# Patient Record
Sex: Female | Born: 1970
Health system: Southern US, Community
[De-identification: ages and names within clinical notes are randomized; demographics above are authoritative.]

## PROBLEM LIST (undated history)

## (undated) DIAGNOSIS — F319 Bipolar disorder, unspecified: Secondary | ICD-10-CM

## (undated) DIAGNOSIS — E349 Endocrine disorder, unspecified: Secondary | ICD-10-CM

## (undated) HISTORY — PX: DILATION AND CURETTAGE OF UTERUS: SHX78

## (undated) HISTORY — DX: Endocrine disorder, unspecified: E34.9

## (undated) HISTORY — PX: CERVICAL BIOPSY  W/ LOOP ELECTRODE EXCISION: SUR135

## (undated) HISTORY — PX: OTHER SURGICAL HISTORY: SHX169

## (undated) HISTORY — DX: Bipolar disorder, unspecified: F31.9

---

## 2017-09-24 DIAGNOSIS — Z0389 Encounter for observation for other suspected diseases and conditions ruled out: Secondary | ICD-10-CM | POA: Diagnosis not present

## 2017-09-24 DIAGNOSIS — E559 Vitamin D deficiency, unspecified: Secondary | ICD-10-CM | POA: Diagnosis not present

## 2017-09-24 DIAGNOSIS — Z0000 Encounter for general adult medical examination without abnormal findings: Secondary | ICD-10-CM | POA: Diagnosis not present

## 2017-09-24 DIAGNOSIS — F3178 Bipolar disorder, in full remission, most recent episode mixed: Secondary | ICD-10-CM | POA: Diagnosis not present

## 2017-09-24 DIAGNOSIS — R6882 Decreased libido: Secondary | ICD-10-CM | POA: Diagnosis not present

## 2017-09-24 DIAGNOSIS — R5383 Other fatigue: Secondary | ICD-10-CM | POA: Diagnosis not present

## 2017-09-24 DIAGNOSIS — Z13220 Encounter for screening for lipoid disorders: Secondary | ICD-10-CM | POA: Diagnosis not present

## 2017-09-25 ENCOUNTER — Ambulatory Visit: Payer: Self-pay | Admitting: Physician Assistant

## 2017-10-19 ENCOUNTER — Telehealth: Payer: Self-pay | Admitting: *Deleted

## 2017-10-19 DIAGNOSIS — F319 Bipolar disorder, unspecified: Secondary | ICD-10-CM | POA: Diagnosis not present

## 2017-10-19 NOTE — Telephone Encounter (Signed)
This person called to be a scheduled as a new patient to scheduled with Lucianne Muss, she stated that she talked with someone in this office and they told her the only thing she have to do choose a doctor, and she can be scheduled. because she work for [employer] I thought maybe I could schedule her, but she did not have any information in the system, she failed to tell me first hand that she just moved from [place], and Just got hired, therefore there was absolutely no information in the system for the doctors to see to schedule her, and I told her that, and she got upset with me because I could not schedule her an appt.  When I asked her her name, she got smart with me because I asked her to repeat her name.  She said that she felt like  She said she felt as if she was defending herself, I apologize to her and said I am sorry you feel that way.    I was very professional with, not upset or rude.  Rosann Auerbach

## 2017-11-14 DIAGNOSIS — E559 Vitamin D deficiency, unspecified: Secondary | ICD-10-CM | POA: Diagnosis not present

## 2017-11-14 DIAGNOSIS — Z0389 Encounter for observation for other suspected diseases and conditions ruled out: Secondary | ICD-10-CM | POA: Diagnosis not present

## 2017-11-14 DIAGNOSIS — R6882 Decreased libido: Secondary | ICD-10-CM | POA: Diagnosis not present

## 2017-11-14 DIAGNOSIS — R5383 Other fatigue: Secondary | ICD-10-CM | POA: Diagnosis not present

## 2017-11-21 DIAGNOSIS — F319 Bipolar disorder, unspecified: Secondary | ICD-10-CM | POA: Diagnosis not present

## 2017-12-06 MED FILL — LITHIUM ER 450 MG TABLET: 450 | 90 days supply | Qty: 180 | Fill #0

## 2017-12-11 MED FILL — NP THYROID 30 MG TABLET: 30 | 30 days supply | Qty: 30 | Fill #0

## 2017-12-18 DIAGNOSIS — F319 Bipolar disorder, unspecified: Secondary | ICD-10-CM | POA: Diagnosis not present

## 2017-12-26 ENCOUNTER — Ambulatory Visit: Payer: Self-pay | Admitting: Family Medicine

## 2018-01-02 DIAGNOSIS — F319 Bipolar disorder, unspecified: Secondary | ICD-10-CM | POA: Diagnosis not present

## 2018-01-14 DIAGNOSIS — R5383 Other fatigue: Secondary | ICD-10-CM | POA: Diagnosis not present

## 2018-01-14 DIAGNOSIS — R6882 Decreased libido: Secondary | ICD-10-CM | POA: Diagnosis not present

## 2018-01-14 DIAGNOSIS — F3178 Bipolar disorder, in full remission, most recent episode mixed: Secondary | ICD-10-CM | POA: Diagnosis not present

## 2018-01-16 DIAGNOSIS — F319 Bipolar disorder, unspecified: Secondary | ICD-10-CM | POA: Diagnosis not present

## 2018-01-18 DIAGNOSIS — F319 Bipolar disorder, unspecified: Secondary | ICD-10-CM | POA: Diagnosis not present

## 2018-01-18 DIAGNOSIS — Z79891 Long term (current) use of opiate analgesic: Secondary | ICD-10-CM | POA: Diagnosis not present

## 2018-01-18 DIAGNOSIS — F411 Generalized anxiety disorder: Secondary | ICD-10-CM | POA: Diagnosis not present

## 2018-01-30 DIAGNOSIS — F319 Bipolar disorder, unspecified: Secondary | ICD-10-CM | POA: Diagnosis not present

## 2018-01-30 DIAGNOSIS — F411 Generalized anxiety disorder: Secondary | ICD-10-CM | POA: Diagnosis not present

## 2018-02-11 ENCOUNTER — Ambulatory Visit: Payer: Self-pay | Admitting: Family Medicine

## 2018-02-28 DIAGNOSIS — F411 Generalized anxiety disorder: Secondary | ICD-10-CM | POA: Diagnosis not present

## 2018-02-28 DIAGNOSIS — F319 Bipolar disorder, unspecified: Secondary | ICD-10-CM | POA: Diagnosis not present

## 2018-03-04 DIAGNOSIS — R5383 Other fatigue: Secondary | ICD-10-CM | POA: Diagnosis not present

## 2018-03-04 DIAGNOSIS — F3178 Bipolar disorder, in full remission, most recent episode mixed: Secondary | ICD-10-CM | POA: Diagnosis not present

## 2018-03-04 DIAGNOSIS — Z5181 Encounter for therapeutic drug level monitoring: Secondary | ICD-10-CM | POA: Diagnosis not present

## 2018-03-18 MED FILL — LITHIUM ER 450 MG TABLET: 450 | 90 days supply | Qty: 180 | Fill #0

## 2018-04-03 DIAGNOSIS — F319 Bipolar disorder, unspecified: Secondary | ICD-10-CM | POA: Diagnosis not present

## 2018-04-03 DIAGNOSIS — F411 Generalized anxiety disorder: Secondary | ICD-10-CM | POA: Diagnosis not present

## 2018-04-10 DIAGNOSIS — F411 Generalized anxiety disorder: Secondary | ICD-10-CM | POA: Diagnosis not present

## 2018-04-10 DIAGNOSIS — F319 Bipolar disorder, unspecified: Secondary | ICD-10-CM | POA: Diagnosis not present

## 2018-05-01 DIAGNOSIS — R05 Cough: Secondary | ICD-10-CM | POA: Diagnosis not present

## 2018-05-01 DIAGNOSIS — F319 Bipolar disorder, unspecified: Secondary | ICD-10-CM | POA: Diagnosis not present

## 2018-05-01 DIAGNOSIS — F411 Generalized anxiety disorder: Secondary | ICD-10-CM | POA: Diagnosis not present

## 2018-05-14 ENCOUNTER — Other Ambulatory Visit (HOSPITAL_COMMUNITY): Payer: Self-pay | Admitting: Internal Medicine

## 2018-05-14 ENCOUNTER — Ambulatory Visit (HOSPITAL_COMMUNITY)
Admission: RE | Admit: 2018-05-14 | Discharge: 2018-05-14 | Disposition: A | Discharge status: Home / Self Care | Payer: 59 | Source: Ambulatory Visit | Attending: Internal Medicine | Admitting: Internal Medicine

## 2018-05-14 DIAGNOSIS — R059 Cough, unspecified: Secondary | ICD-10-CM

## 2018-05-14 DIAGNOSIS — R05 Cough: Secondary | ICD-10-CM | POA: Insufficient documentation

## 2018-05-14 DIAGNOSIS — R0602 Shortness of breath: Secondary | ICD-10-CM | POA: Diagnosis not present

## 2018-05-21 DIAGNOSIS — R05 Cough: Secondary | ICD-10-CM | POA: Diagnosis not present

## 2018-05-21 MED FILL — NP THYROID 90 MG TABLET: 90 | 30 days supply | Qty: 30 | Fill #0

## 2018-05-30 DIAGNOSIS — F411 Generalized anxiety disorder: Secondary | ICD-10-CM | POA: Diagnosis not present

## 2018-05-30 DIAGNOSIS — F319 Bipolar disorder, unspecified: Secondary | ICD-10-CM | POA: Diagnosis not present

## 2018-06-17 ENCOUNTER — Ambulatory Visit (INDEPENDENT_AMBULATORY_CARE_PROVIDER_SITE_OTHER): Payer: 59 | Admitting: Internal Medicine

## 2018-06-17 ENCOUNTER — Encounter: Payer: Self-pay | Admitting: Internal Medicine

## 2018-06-17 DIAGNOSIS — J45991 Cough variant asthma: Secondary | ICD-10-CM | POA: Diagnosis not present

## 2018-06-17 LAB — NITRIC OXIDE: NITRIC OXIDE: 30

## 2018-06-17 MED ORDER — BENZONATATE 200 MG PO CAPS: 200.0000 mg | ORAL_CAPSULE | Freq: Three times a day (TID) | ORAL | 1 refills | Status: DC | PRN

## 2018-06-17 MED ORDER — METHYLPREDNISOLONE ACETATE 80 MG/ML IJ SUSP
120.0000 mg | Freq: Once | INTRAMUSCULAR | Status: AC
  Administered 2018-06-17: 120 mg via INTRAMUSCULAR

## 2018-06-17 MED ORDER — PANTOPRAZOLE SODIUM 40 MG PO TBEC: 40.0000 mg | DELAYED_RELEASE_TABLET | Freq: Every day | ORAL | 2 refills | Status: DC

## 2018-06-17 MED ORDER — FAMOTIDINE 20 MG PO TABS: ORAL_TABLET | ORAL | 11 refills | Status: DC

## 2018-06-17 MED FILL — BENZONATATE 200 MG CAPS: 200 | 10 days supply | Qty: 30 | Fill #0

## 2018-06-17 MED FILL — PANTOPRAZOLE SOD DR 40 MG T: 40 | 30 days supply | Qty: 30 | Fill #0

## 2018-06-17 NOTE — Progress Notes (Signed)
Nelva NayPatricia Bednarz, female    DOB: 1970/08/17,     MRN: [id number]   Brief patient profile:  47 yo never smoker with new onset daily cough since 05/04/18 referred to pulmonary clinic 06/17/2018 by Dr   Karilyn CotaGosrani   H/o peanut anaphylaxis mid 6220s and h/o itchy eyes with pollen, nose running, sneezing but not coughing or wheezing >>> allergy eval age 47 but nothing rec benadryl and epi pen     History of Present Illness  06/17/2018  Pulmonary/ 1st office eval/Jordana Dugue  Chief Complaint  Patient presents with  . Pulmonary Consult    Referred by Dr. Karilyn CotaGosrani.  Pt c/o cough since 04/24/18.  Cough is non prod. She is worse when she is exposed to cats and dogs.   arrived in [place] around feb [place] and then [place] into house Feb 07 2018 but w/in a month of living [place] noted itchy eyes/ sneezing rx clariton and zyrtec and bad allergy season then bad dry cough 04/24/18 p Choked on hot coffee and rx tessalon and depomedrol shot and started on singulair plus prednisone rx and mucinex dx and albuterol transiently felt felt fine but worse w/in 2 week of finishing prednisone sp referred to pulmonary clinic 06/17/2018 by Dr   Kevan RosebushGostroni    Kouffman Reflux v Neurogenic Cough Differentiator Reflux Comments  Do you awaken from a sound sleep coughing violently?                            With trouble breathing? No , maybe at onset    Do you have choking episodes when you cannot  Get enough air, gasping for air ?              Yes   Do you usually cough when you lie down into  The bed, or when you just lie down to rest ?                          Sometimes    Do you usually cough after meals or eating?         no   Do you cough when (or after) you bend over?    Yes   GERD SCORE     Kouffman Reflux v Neurogenic Cough Differentiator Neurogenic   Do you more-or-less cough all day long? sporadic   Does change of temperature make you cough? Yes, cold   Does laughing or chuckling cause you to cough? Yes def   Do fumes  (perfume, automobile fumes, burned  Toast, etc.,) cause you to cough ?      perfume   Does speaking, singing, or talking on the phone cause you to cough   ?               Yes    Neurogenic/Airway score      Not limited by breathing from desired activities    No obvious other patterns in terms of cough with day to day or daytime variability or assoc excess/ purulent sputum or mucus plugs or hemoptysis or cp or chest tightness, subjective wheeze or overt sinus or hb symptoms.   Sleeping ok now without nocturnal  or early am exacerbation  of respiratory  c/o's or need for noct saba. Also denies any obvious fluctuation of symptoms with weather or environmental changes or other aggravating or alleviating factors except as outlined above   No  unusual exposure hx or h/o childhood pna/ asthma or knowledge of premature birth.  Current Allergies, Complete Past Medical History, Past Surgical History, Family History, and Social History were reviewed in [facility] electronic medical record.  ROS  The following are not active complaints unless bolded Hoarseness, sore throat, dysphagia, dental problems, itching, sneezing,  nasal congestion or discharge of excess mucus or purulent secretions, ear ache,   fever, chills, sweats, unintended wt loss or wt gain, classically pleuritic or exertional cp,  orthopnea pnd or arm/hand swelling  or leg swelling, presyncope, palpitations, abdominal pain, anorexia, nausea, vomiting, diarrhea  or change in bowel habits or change in bladder habits, change in stools or change in urine, dysuria, hematuria,  rash, arthralgias, visual complaints, headache, numbness, weakness or ataxia or problems with walking or coordination,  change in mood or  memory.             No past medical history on file.  Outpatient Medications Prior to Visit  Medication Sig Dispense Refill  . benzonatate (TESSALON) 100 MG capsule 1 3 x daily as needed  0  . Dextromethorphan-guaiFENesin  (MUCINEX DM) 30-600 MG TB12 1 tablet 2 (two) times daily as needed.    . lithium carbonate (ESKALITH) 450 MG CR tablet Take 900 mg by mouth at bedtime.    Marland Kitchen. MAGNESIUM PO Take by mouth daily.    . OIL OF OREGANO PO Take by mouth.    . thyroid (ARMOUR) 60 MG tablet Take 60 mg by mouth daily before breakfast.    . traZODone (DESYREL) 50 MG tablet 1/4 tablet at bedtime     No facility-administered medications prior to visit.      Objective:     BP 98/64 (BP Location: Left Arm, Cuff Size: Normal)   Pulse 64   Ht 5\' 8"  (1.727 m)   Wt 139 lb 6.4 oz (63.2 kg)   SpO2 96%   BMI 21.20 kg/m   SpO2: 96 %  RA   Very pleasant amb asian femal nad   HEENT: nl dentition,  and oropharynx. Nl external ear canals without cough reflex - min bilateral non-specific turbinate edema     NECK :  without JVD/Nodes/TM/ nl carotid upstrokes bilaterally   LUNGS: no acc muscle use,  Nl contour chest which is clear to A and P bilaterally without cough on insp or exp maneuvers   CV:  RRR  no s3 or murmur or increase in P2, and no edema   ABD:  soft and nontender with nl inspiratory excursion in the supine position. No bruits or organomegaly appreciated, bowel sounds nl  MS:  Nl gait/ ext warm without deformities, calf tenderness, cyanosis or clubbing No obvious joint restrictions   SKIN: warm and dry without lesions    NEURO:  alert, approp, nl sensorium with  no motor or cerebellar deficits apparent.      I personally reviewed images and agree with radiology impression as follows:  CXR:   05/14/18 No edema or consolidation.   Labs ordered 06/17/2018  Allergy profile      Assessment   Cough variant asthma vs UACS Rhinitis onset p arrived in [place] Feb 2019  Onset abrupt 04/24/18  p drank hot coffee - FENO 06/17/2018  =   30 - Allergy profile 06/17/2018 >  Eos 0. /  IgE  pending - cyclical cough regimen 06/17/2018   Response to prednisone suggests an allergy mechanism and feno is  borderline elevated but absence of noct symptoms  and cough questionaire favor    Upper airway cough syndrome (previously labeled PNDS),  is so named because it's frequently impossible to sort out how much is  CR/sinusitis with freq throat clearing (which can be related to primary GERD)   vs  causing  secondary (" extra esophageal")  GERD from wide swings in gastric pressure that occur with throat clearing, often  promoting self use of mint and menthol lozenges that reduce the lower esophageal sphincter tone and exacerbate the problem further in a cyclical fashion.   These are the same pts (now being labeled as having "irritable larynx syndrome" by some cough centers) who not infrequently have a history of having failed to tolerate ace inhibitors,  dry powder inhalers or biphosphonates or report having atypical/extraesophageal reflux symptoms that don't respond to standard doses of PPI  and are easily confused as having aecopd or asthma flares by even experienced allergists/ pulmonologists (myself included).    Of the three most common causes of  Sub-acute / recurrent or chronic cough, only one (GERD)  can actually contribute to/ trigger  the other two (asthma and post nasal drip syndrome)  and perpetuate the cylce of cough.  While not intuitively obvious, many patients with chronic low grade reflux do not cough until there is a primary insult that disturbs the protective epithelial barrier and exposes sensitive nerve endings.   This is typically viral but can due to PNDS and  either may apply here.     >>>The point is that once this occurs, it is difficult to eliminate the cycle  using anything but a maximally effective acid suppression regimen at least in the short run, accompanied by an appropriate diet to address non acid GERD and control pnds as a suspect with 1st gen H1 blockers per guidelines  / eliminate the cough itself for at least 3 days with tylenol #3 then regroup in 2 weeks with all meds in  hand using a trust but verify approach to confirm accurate Medication  Reconciliation The principal here is that until we are certain that the  patients are doing what we've asked, it makes no sense to ask them to do more.          Total time devoted to counseling  > 50 % of initial 60 min office visit:  review case with pt/ discussion of options/alternatives/ personally creating written customized instructions  in presence of pt  then going over those specific  Instructions directly with the pt including how to use all of the meds but in particular covering each new medication in detail and the difference between the maintenance= "automatic" meds and the prns using an action plan format for the latter (If this problem/symptom => do that organization reading Left to right).  Please see AVS from this visit for a full list of these instructions which I personally wrote for this pt and  are unique to this visit.      Sandrea Hughs, MD 06/17/2018

## 2018-06-17 NOTE — Patient Instructions (Addendum)
The key to effective treatment for your cough is eliminating the non-stop cycle of cough you're stuck in long enough to let your airway heal completely and then see if there is anything still making you cough once you stop the cough suppression, but this should take no more than 5 days to figure out.  First take tessalon 200 mg up to every 6 hours and supplement if needed with  Tylenol #3   up to 1 every 4 hours to suppress the urge to cough at all or even clear your throat. Swallowing water or using ice chips/non mint and menthol containing candies (such as lifesavers or sugarless jolly ranchers) are also effective.  You should rest your voice and avoid activities that you know make you cough.  Once you have eliminated the cough for 3 straight days try reducing the tylenol #3  first,  then the delsym as tolerated.    For drainage / throat tickle try take CHLORPHENIRAMINE  4 mg (cl[facility]abs at [facility]) - take one every 4 hours as needed - available over the counter- may cause drowsiness so start with just a bedtime dose or two and see how you tolerate it before trying in daytime    Protonix (pantoprazole) Take 30-60 min before first meal of the day and Pepcid 20 mg one bedtime    until returns  completely gone for at least a week without the need for cough suppression  GERD (REFLUX)  is an extremely common cause of respiratory symptoms, many times with no significant heartburn at all.    It can be treated with medication, but also with lifestyle changes including avoidance of late meals, excessive alcohol, smoking cessation, and avoid fatty foods, chocolate, peppermint, colas, red wine, and acidic juices such as orange juice.  NO MINT OR MENTHOL PRODUCTS SO NO COUGH DROPS   USE HARD CANDY INSTEAD (jolley ranchers or Stover's or Lifesavers (all available in sugarless versions) NO OIL BASED VITAMINS - use powdered substitutes.   Depomedrol 120 mg IM and stay on singulair 10 mg daily (montelukast)    Please remember to go to the lab department   for your tests - we will call you with the results when they are available.      Please schedule a follow up office visit in 2 weeks, sooner if needed  with all medications /inhalers/ solutions in hand so we can verify exactly what you are taking. This includes all medications from all doctors and over the counters

## 2018-06-18 LAB — RESPIRATORY ALLERGY PROFILE REGION II ~~LOC~~
Allergen, A. alternata, m6: 1.92 kU/L — ABNORMAL HIGH
Allergen, Cedar tree, t12: <0.1 kU/L
Allergen, Comm Silver Birch, t9: 2.47 kU/L — ABNORMAL HIGH
Allergen, D pternoyssinus,d7: 0.67 kU/L — ABNORMAL HIGH
Allergen, Mouse Urine Protein, e78: <0.1 kU/L
Allergen, Mulberry, t76: <0.1 kU/L
Allergen, Oak,t7: 2.47 kU/L — ABNORMAL HIGH
Allergen, P. notatum, m1: 0.22 kU/L — ABNORMAL HIGH
Aspergillus fumigatus, m3: 0.31 kU/L — ABNORMAL HIGH
Bermuda Grass: <0.1 kU/L
Box Elder IgE: 0.11 kU/L — ABNORMAL HIGH
CLADOSPORIUM HERBARUM (M2) IGE: 0.13 kU/L — ABNORMAL HIGH
CLASS: 0
CLASS: 0
CLASS: 0
CLASS: 0
CLASS: 2
CLASS: 2
COMMON RAGWEED (SHORT) (W1) IGE: 0.69 kU/L — ABNORMAL HIGH
Cat Dander: 1.66 kU/L — ABNORMAL HIGH
Class: 0
Class: 0
Class: 0
Class: 0
Class: 0
Class: 0
Class: 0
Class: 0
Class: 0
Class: 0
Class: 0
Class: 0
Class: 1
Class: 1
Class: 2
Class: 2
Class: 2
Class: 2
Cockroach: <0.1 kU/L
D. farinae: 0.85 kU/L — ABNORMAL HIGH
Dog Dander: 1.13 kU/L — ABNORMAL HIGH
Elm IgE: <0.1 kU/L
IgE (Immunoglobulin E), Serum: 53 kU/L (ref <=114)
Johnson Grass: <0.1 kU/L
Pecan/Hickory Tree IgE: <0.1 kU/L
Rough Pigweed  IgE: <0.1 kU/L
Sheep Sorrel IgE: <0.1 kU/L
Timothy Grass: <0.1 kU/L

## 2018-06-18 LAB — CBC WITH DIFFERENTIAL/PLATELET
Basophils Absolute: 0.1 10*3/uL (ref 0.0–0.1)
Basophils Relative: 1.2 % (ref 0.0–3.0)
Eosinophils Absolute: 0.2 10*3/uL (ref 0.0–0.7)
Eosinophils Relative: 4.2 % (ref 0.0–5.0)
HCT: 43.5 % (ref 36.0–46.0)
Hemoglobin: 14.1 g/dL (ref 12.0–15.0)
Lymphocytes Relative: 34.8 % (ref 12.0–46.0)
Lymphs Abs: 2.1 10*3/uL (ref 0.7–4.0)
MCHC: 32.4 g/dL (ref 30.0–36.0)
MCV: 86.3 fl (ref 78.0–100.0)
Monocytes Absolute: 0.3 10*3/uL (ref 0.1–1.0)
Monocytes Relative: 5.7 % (ref 3.0–12.0)
Neutro Abs: 3.2 10*3/uL (ref 1.4–7.7)
Neutrophils Relative %: 54.1 % (ref 43.0–77.0)
Platelets: 236 10*3/uL (ref 150.0–400.0)
RBC: 5.04 Mil/uL (ref 3.87–5.11)
RDW: 12.4 % (ref 11.5–15.5)
WBC: 5.9 10*3/uL (ref 4.0–10.5)

## 2018-06-18 LAB — INTERPRETATION:

## 2018-06-18 NOTE — Assessment & Plan Note (Signed)
Rhinitis onset p arrived in [place] Feb 2019  Onset abrupt 04/24/18  p drank hot coffee - FENO 06/17/2018  =   30 - Allergy profile 06/17/2018 >  Eos 0. /  IgE  pending - cyclical cough regimen 06/17/2018   Response to prednisone suggests an allergy mechanism and feno is borderline elevated but absence of noct symptoms and cough questionaire favor    Upper airway cough syndrome (previously labeled PNDS),  is so named because it's frequently impossible to sort out how much is  CR/sinusitis with freq throat clearing (which can be related to primary GERD)   vs  causing  secondary (" extra esophageal")  GERD from wide swings in gastric pressure that occur with throat clearing, often  promoting self use of mint and menthol lozenges that reduce the lower esophageal sphincter tone and exacerbate the problem further in a cyclical fashion.   These are the same pts (now being labeled as having "irritable larynx syndrome" by some cough centers) who not infrequently have a history of having failed to tolerate ace inhibitors,  dry powder inhalers or biphosphonates or report having atypical/extraesophageal reflux symptoms that don't respond to standard doses of PPI  and are easily confused as having aecopd or asthma flares by even experienced allergists/ pulmonologists (myself included).    Of the three most common causes of  Sub-acute / recurrent or chronic cough, only one (GERD)  can actually contribute to/ trigger  the other two (asthma and post nasal drip syndrome)  and perpetuate the cylce of cough.  While not intuitively obvious, many patients with chronic low grade reflux do not cough until there is a primary insult that disturbs the protective epithelial barrier and exposes sensitive nerve endings.   This is typically viral but can due to PNDS and  either may apply here.     >>>The point is that once this occurs, it is difficult to eliminate the cycle  using anything but a maximally effective acid suppression  regimen at least in the short run, accompanied by an appropriate diet to address non acid GERD and control pnds as a suspect with 1st gen H1 blockers per guidelines  / eliminate the cough itself for at least 3 days with tylenol #3 then regroup in 2 weeks with all meds in hand using a trust but verify approach to confirm accurate Medication  Reconciliation The principal here is that until we are certain that the  patients are doing what we've asked, it makes no sense to ask them to do more.     Total time devoted to counseling  > 50 % of initial 60 min office visit:  review case with pt/ discussion of options/alternatives/ personally creating written customized instructions  in presence of pt  then going over those specific  Instructions directly with the pt including how to use all of the meds but in particular covering each new medication in detail and the difference between the maintenance= "automatic" meds and the prns using an action plan format for the latter (If this problem/symptom => do that organization reading Left to right).  Please see AVS from this visit for a full list of these instructions which I personally wrote for this pt and  are unique to this visit.

## 2018-06-20 ENCOUNTER — Telehealth: Payer: Self-pay | Admitting: *Deleted

## 2018-06-20 NOTE — Telephone Encounter (Signed)
LMTCB

## 2018-06-20 NOTE — Telephone Encounter (Signed)
-----   Message from Nyoka Cowden, MD sent at 06/20/2018  1:25 PM EST ----- Let her know allergy tests mildly pos cat  dog tree dust mold Rx is avoidance, Be sure patient has/keeps f/u ov so we can go over all the details of this study and get a plan together moving forward - ok to move up f/u if not feeling better and wants to be seen sooner

## 2018-06-21 NOTE — Telephone Encounter (Signed)
Spoke with pt and notified of results per Dr. Wert. Pt verbalized understanding and denied any questions. 

## 2018-06-26 MED FILL — LITHIUM ER 450 MG TABLET: 450 | 90 days supply | Qty: 180 | Fill #1

## 2018-07-01 ENCOUNTER — Ambulatory Visit (INDEPENDENT_AMBULATORY_CARE_PROVIDER_SITE_OTHER): Payer: 59 | Admitting: Internal Medicine

## 2018-07-01 ENCOUNTER — Encounter: Payer: Self-pay | Admitting: Internal Medicine

## 2018-07-01 DIAGNOSIS — J45991 Cough variant asthma: Secondary | ICD-10-CM

## 2018-07-01 MED ORDER — MONTELUKAST SODIUM 10 MG PO TABS: 10.0000 mg | ORAL_TABLET | Freq: Every day | ORAL | 11 refills | Status: DC

## 2018-07-01 MED FILL — MONTELUKAST SOD 10 MG TAB: 10 | 30 days supply | Qty: 30 | Fill #0

## 2018-07-01 NOTE — Patient Instructions (Addendum)
Singulair 10 mg one daily in evening /nights   Stay on pantoprazole 40 mg  Take 30-60 min before first meal of the day   For drainage / throat tickle try take CHLORPHENIRAMINE  4 mg - take one every 4 hours as needed - available over the counter- may cause drowsiness so start with just a bedtime dose or two and see how you tolerate it before trying in daytime    For cough >  Use tessalon 200 mg up three times daily as needed    GERD (REFLUX)  is an extremely common cause of respiratory symptoms just like yours , many times with no obvious heartburn at all.    It can be treated with medication, but also with lifestyle changes including elevation of the head of your bed (ideally with 6 -8inch blocks under the headboard of your bed),  Smoking cessation, avoidance of late meals, excessive alcohol, and avoid fatty foods, chocolate, peppermint, colas, red wine, and acidic juices such as orange juice or excessive coffee or tea  NO MINT OR MENTHOL PRODUCTS SO NO COUGH DROPS  USE SUGARLESS CANDY INSTEAD (Jolley ranchers or Stover's or Environmental manager) or even ice chips will also do - the key is to swallow to prevent all throat clearing. NO OIL BASED VITAMINS - use powdered substitutes.  Avoid fish oil when coughing.    Please schedule a follow up visit in 2 months but call sooner if needed

## 2018-07-01 NOTE — Progress Notes (Addendum)
Paige Bowen, female    DOB: 1971-06-12,     MRN: [id number]     Brief patient profile:  48 yo never smoker with new onset daily cough since 05/04/18 referred to pulmonary clinic 06/17/2018 by Dr   Karilyn Cota   H/o peanut anaphylaxis mid 103s and h/o itchy eyes with pollen, nose running, sneezing but not coughing or wheezing >>> allergy eval age 4 but nothing rec benadryl and epi pen     History of Present Illness  06/17/2018  Pulmonary/ 1st office eval/Paige Bowen  Chief Complaint  Patient presents with  . Pulmonary Consult    Referred by Dr. Karilyn Cota.  Pt c/o cough since 04/24/18.  Cough is non prod. She is worse when she is exposed to cats and dogs.   arrived in [place] around feb 2019 lived in [place] and then moved into house Feb 07 2018 but w/in a month of living condo noted itchy eyes/ sneezing rx clariton and zyrtec and bad allergy season then bad dry cough 04/24/18 p Choked on hot coffee and rx tessalon and depomedrol shot and started on singulair plus prednisone rx and mucinex dx and albuterol transiently felt felt fine but worse w/in 2 week of finishing prednisone sp referred to pulmonary clinic 06/17/2018 by Dr   Kevan Rosebush Reflux v Neurogenic Cough Differentiator Reflux Comments  Do you awaken from a sound sleep coughing violently?                            With trouble breathing? No , maybe at onset    Do you have choking episodes when you cannot  Get enough air, gasping for air ?              Yes   Do you usually cough when you lie down into  The bed, or when you just lie down to rest ?                          Sometimes    Do you usually cough after meals or eating?         no   Do you cough when (or after) you bend over?    Yes   GERD SCORE     Kouffman Reflux v Neurogenic Cough Differentiator Neurogenic   Do you more-or-less cough all day long? sporadic   Does change of temperature make you cough? Yes, cold   Does laughing or chuckling cause you to cough? Yes def   Do  fumes (perfume, automobile fumes, burned  Toast, etc.,) cause you to cough ?      perfume   Does speaking, singing, or talking on the phone cause you to cough   ?               Yes    Neurogenic/Airway score    Not limited by breathing from desired activities  rec The key to effective treatment for your cough is eliminating the non-stop cycle of cough you're stuck in long enough to let your airway heal completely and then see if there is anything still making you cough once you stop the cough suppression, but this should take no more than 5 days to figure out. First take tessalon 200 mg up to every 6 hours and supplement if needed with  Tylenol #3   up to 1 every 4 hours to suppress the urge to  cough at all or even clear your throat. Swallowing water or using ice chips/non mint and menthol containing candies (such as lifesavers or sugarless jolly ranchers) are also effective.  You should rest your voice and avoid activities that you know make you cough. Once you have eliminated the cough for 3 straight days try reducing the tylenol #3  first,  then the delsym as tolerated.   For drainage / throat tickle try take CHLORPHENIRAMINE  4 mg ([facility]) - take one every 4 hours as needed - available over the counter- may cause drowsiness so start with just a bedtime dose or two and see how you tolerate it before trying in daytime   Protonix (pantoprazole) Take 30-60 min before first meal of the day and Pepcid 20 mg one bedtime    until returns  completely gone for at least a week without the need for cough suppression GERD diet   Depomedrol 120 mg IM and stay on singulair 10 mg daily (montelukast)  Please remember to go to the lab department   for your tests - we will call you with the results when they are available.    Please schedule a follow up office visit in 2 weeks, sooner if needed  with all medications /inhalers/ solutions in hand so we can verify exactly what you are taking. This  includes all medications from all doctors and over the counters   07/01/2018  f/u ov/Paige Bowen re: cough x 04/2018  Chief Complaint  Patient presents with  . Follow-up    Cough has resolved. She has had slight hoarseness over the past 2 days.   Dyspnea:   Not limited by breathing from desired activities  // hiking one day prior to OV  [place] Cough: gone for now / no longer needing tessalon Sleeping: ok now SABA use: has alb not using 02: no  Some hoarseness and sense of nasal congestion pnds around cats Has been prescribed singulair but not using consistently    No obvious day to day or daytime variability or assoc excess/ purulent sputum or mucus plugs or hemoptysis or cp or chest tightness, subjective wheeze or overt sinus or hb symptoms.   Sleeping  without nocturnal  or early am exacerbation  of respiratory  c/o's or need for noct saba. Also denies any obvious fluctuation of symptoms with weather or environmental changes or other aggravating or alleviating factors except as outlined above   No unusual exposure hx or h/o childhood pna/ asthma or knowledge of premature birth.  Current Allergies, Complete Past Medical History, Past Surgical History, Family History, and Social History were reviewed in [facility] electronic medical record.  ROS  The following are not active complaints unless bolded Hoarseness, sore throat, dysphagia, dental problems, itching, sneezing,  nasal congestion or discharge of excess mucus or purulent secretions, ear ache,   fever, chills, sweats, unintended wt loss or wt gain, classically pleuritic or exertional cp,  orthopnea pnd or arm/hand swelling  or leg swelling, presyncope, palpitations, abdominal pain, anorexia, nausea, vomiting, diarrhea  or change in bowel habits or change in bladder habits, change in stools or change in urine, dysuria, hematuria,  rash, arthralgias, visual complaints, headache, numbness, weakness or ataxia or problems with walking or  coordination,  change in mood or  memory.        Current Meds  Medication Sig  . benzonatate (TESSALON) 200 MG capsule Take 1 capsule (200 mg total) by mouth 3 (three) times daily as needed  for cough.  . lithium carbonate (ESKALITH) 450 MG CR tablet Take 900 mg by mouth at bedtime.  Marland Kitchen MAGNESIUM PO Take by mouth daily.  . pantoprazole (PROTONIX) 40 MG tablet Take 1 tablet (40 mg total) by mouth daily. Take 30-60 min before first meal of the day  . thyroid (ARMOUR) 60 MG tablet Take 60 mg by mouth daily before breakfast.  . traZODone (DESYREL) 50 MG tablet 1/4 tablet at bedtime as needed                    Objective:    amb asian female  nad  Wt Readings from Last 3 Encounters:  07/01/18 141 lb (64 kg)  06/17/18 139 lb 6.4 oz (63.2 kg)     Vital signs reviewed - Note on arrival 02 sats  98% on RA    HEENT: nl dentition, turbinates bilaterally, and oropharynx. Nl external ear canals without cough reflex   NECK :  without JVD/Nodes/TM/ nl carotid upstrokes bilaterally   LUNGS: no acc muscle use,  Nl contour chest which is clear to A and P bilaterally without cough on insp or exp maneuvers   CV:  RRR  no s3 or murmur or increase in P2, and no edema   ABD:  soft and nontender with nl inspiratory excursion in the supine position. No bruits or organomegaly appreciated, bowel sounds nl  MS:  Nl gait/ ext warm without deformities, calf tenderness, cyanosis or clubbing No obvious joint restrictions   SKIN: warm and dry without lesions    NEURO:  alert, approp, nl sensorium with  no motor or cerebellar deficits apparent.              Assessment

## 2018-07-02 ENCOUNTER — Encounter: Payer: Self-pay | Admitting: Internal Medicine

## 2018-07-02 NOTE — Assessment & Plan Note (Signed)
Rhinitis onset p arrived in [place] Feb 2019   Cough Onset abrupt 04/24/18  p drank hot coffee - FENO 06/17/2018  =   30 - Allergy profile 06/17/2018 >  Eos 0.2 /  IgE  53 RAST pos cat dog tee dust and mold  - cyclical cough regimen 06/17/2018   - rec maint on singulair 07/01/2018 and continue protonix x 3 months  07/01/2018  After extensive coaching inhaler device,  effectiveness =    90% from a baseline of about 50%  Still not clear whether this is all uacs vs asthma so will try maint rx with ppi and singulair with prn tessalon/ saba x 2 m then regroup  Advised: The standardized cough guidelines published in Chest by [place] in 2006 are still the best available and consist of a multiple step process (up to 12!) , not a single office visit,  and are intended  to address this problem logically,  with an alogrithm dependent on response to empiric treatment at  each progressive step  to determine a specific diagnosis with  minimal addtional testing needed. Therefore if adherence is an issue or can't be accurately verified,  it's very unlikely the standard evaluation and treatment will be successful here.    Furthermore, response to therapy (other than acute cough suppression, which should only be used short term with avoidance of narcotic containing cough syrups if possible), can be a gradual process for which the patient is not likely to  perceive immediate benefit.  Unlike going to an eye doctor where the best perscription is almost always the first one and is immediately effective, this is almost never the case in the management of chronic cough syndromes. Therefore the patient needs to commit up front to consistently adhere to recommendations  for up to 6 weeks of therapy directed at the likely underlying problem(s) before the response can be reasonably evaluated.     I had an extended discussion with the patient reviewing all relevant studies completed to date and  lasting 15 to 20 minutes of  a 25 minute visit    See device teaching which extended face to face time for this visit.  Each maintenance medication was reviewed in detail including emphasizing most importantly the difference between maintenance and prns and under what circumstances the prns are to be triggered using an action plan format that is not reflected in the computer generated alphabetically organized AVS which I have not found useful in most complex patients, especially with respiratory illnesses  Please see AVS for specific instructions unique to this visit that I personally wrote and verbalized to the the pt in detail and then reviewed with pt  by my nurse highlighting any  changes in therapy recommended at today's visit to their plan of care.

## 2018-07-22 MED FILL — PANTOPRAZOLE SOD DR 40 MG T: 40 | 30 days supply | Qty: 30 | Fill #1

## 2018-07-22 MED FILL — BENZONATATE 200 MG CAPS: 200 | 10 days supply | Qty: 30 | Fill #1

## 2018-07-23 DIAGNOSIS — F319 Bipolar disorder, unspecified: Secondary | ICD-10-CM | POA: Diagnosis not present

## 2018-07-23 DIAGNOSIS — F411 Generalized anxiety disorder: Secondary | ICD-10-CM | POA: Diagnosis not present

## 2018-07-25 DIAGNOSIS — R05 Cough: Secondary | ICD-10-CM | POA: Diagnosis not present

## 2018-07-25 DIAGNOSIS — R5383 Other fatigue: Secondary | ICD-10-CM | POA: Diagnosis not present

## 2018-07-25 MED FILL — MONTELUKAST SOD 10 MG TAB: 10 | 30 days supply | Qty: 30 | Fill #1

## 2018-07-29 MED FILL — NP THYROID 90 MG TABLET: 90 | 90 days supply | Qty: 90 | Fill #0

## 2018-08-07 DIAGNOSIS — F411 Generalized anxiety disorder: Secondary | ICD-10-CM | POA: Diagnosis not present

## 2018-08-07 DIAGNOSIS — F319 Bipolar disorder, unspecified: Secondary | ICD-10-CM | POA: Diagnosis not present

## 2018-08-12 DIAGNOSIS — F411 Generalized anxiety disorder: Secondary | ICD-10-CM | POA: Diagnosis not present

## 2018-08-12 DIAGNOSIS — F319 Bipolar disorder, unspecified: Secondary | ICD-10-CM | POA: Diagnosis not present

## 2018-08-20 DIAGNOSIS — F411 Generalized anxiety disorder: Secondary | ICD-10-CM | POA: Diagnosis not present

## 2018-08-20 DIAGNOSIS — F319 Bipolar disorder, unspecified: Secondary | ICD-10-CM | POA: Diagnosis not present

## 2018-08-23 MED FILL — MONTELUKAST SOD 10 MG TAB: 10 | 30 days supply | Qty: 30 | Fill #2 | Status: TO

## 2018-08-30 ENCOUNTER — Ambulatory Visit: Payer: 59 | Admitting: Internal Medicine

## 2018-09-03 DIAGNOSIS — F319 Bipolar disorder, unspecified: Secondary | ICD-10-CM | POA: Diagnosis not present

## 2018-09-03 DIAGNOSIS — F411 Generalized anxiety disorder: Secondary | ICD-10-CM | POA: Diagnosis not present

## 2018-09-04 MED FILL — PANTOPRAZOLE SOD DR 40 MG T: 40 | 30 days supply | Qty: 30 | Fill #2

## 2018-09-05 ENCOUNTER — Other Ambulatory Visit: Payer: Self-pay

## 2018-09-06 ENCOUNTER — Encounter: Payer: Self-pay | Admitting: Obstetrics & Gynecology

## 2018-09-06 ENCOUNTER — Ambulatory Visit (INDEPENDENT_AMBULATORY_CARE_PROVIDER_SITE_OTHER): Payer: 59 | Admitting: Obstetrics & Gynecology

## 2018-09-06 VITALS — BP 122/74 | Ht 67.0 in | Wt 140.0 lb

## 2018-09-06 DIAGNOSIS — N951 Menopausal and female climacteric states: Secondary | ICD-10-CM

## 2018-09-06 DIAGNOSIS — N921 Excessive and frequent menstruation with irregular cycle: Secondary | ICD-10-CM

## 2018-09-06 DIAGNOSIS — E039 Hypothyroidism, unspecified: Secondary | ICD-10-CM | POA: Diagnosis not present

## 2018-09-06 LAB — CBC
HCT: 42.3 % (ref 35.0–45.0)
Hemoglobin: 14.1 g/dL (ref 11.7–15.5)
MCH: 28.4 pg (ref 27.0–33.0)
MCHC: 33.3 g/dL (ref 32.0–36.0)
MCV: 85.1 fL (ref 80.0–100.0)
MPV: 9.7 fL (ref 7.5–12.5)
Platelets: 243 10*3/uL (ref 140–400)
RBC: 4.97 10*6/uL (ref 3.80–5.10)
RDW: 11.6 % (ref 11.0–15.0)
WBC: 5.1 10*3/uL (ref 3.8–10.8)

## 2018-09-06 LAB — TSH: TSH: 0.07 mIU/L — ABNORMAL LOW

## 2018-09-06 MED ORDER — NORETHINDRONE 0.35 MG PO TABS: 1.0000 | ORAL_TABLET | Freq: Every day | ORAL | 11 refills | Status: DC

## 2018-09-06 MED FILL — NORLYDA 0.35 MG TABS: 0.35 | 28 days supply | Qty: 28 | Fill #0

## 2018-09-06 NOTE — Progress Notes (Signed)
    Paige Bowen 1970-10-15 [id number]        48 y.o.  G0P0000 Divorced.  Stable boyfriend x 4 years.  RP: Menorrhagia  X 2 months  HPI:  Spaced her menstrual periods x 5 months, and had a very heavy period after that last month with pelvic cramps and overflow.  Started her period today again and the flow is heavy, but not as much.  Occasional hot flushes.  No pelvic pain outside of menstrual periods.  Normal vaginal secretions.  No pain with IC.  Urine/BMs normal.  H/O LEEP x 2, last one 20 yrs ago.  Due for Annual/Gyn exam.   OB History  Gravida Para Term Preterm AB Living  0 0 0 0 0 0  SAB TAB Ectopic Multiple Live Births  0 0 0 0 0    Past medical history,surgical history, problem list, medications, allergies, family history and social history were all reviewed and documented in the EPIC chart.   Directed ROS with pertinent positives and negatives documented in the history of present illness/assessment and plan.  Exam:  Vitals:   09/06/18 1007  BP: 122/74  Weight: 140 lb (63.5 kg)  Height: 5\' 7"  (1.702 m)   General appearance:  Normal  Abdomen: Normal  Gynecologic exam: Vulva normal.  Speculum:  Cervix, Vagina normal.  Mild dark menstrual blood.  Bimanual exam:  Uterus AV, Normal volume, NT, mobile.  No adnexal mass, NT.   Assessment/Plan:  48 y.o. G0P0000   1. Menorrhagia with irregular cycle Oligo-menorrhagia probably associated with Peri-menopause.  Rule out endometrial pathology with a pelvic ultrasound.  Rule out thyroid dysfunction and treatment as patient is on Armour Thyroid.  Rule out anemia.  Decision to attempt control with the progestin only birth control pill.  Usage reviewed and prescription sent to pharmacy. - CBC - TSH - US Transvaginal Non-OB; Future  2. Perimenopause Probable perimenopausal, will rule out other causes of Oligo-menorrhagia.  3. Acquired hypothyroidism On Armour thyroid, will check TSH today. - TSH  Other orders -  norethindrone (MICRONOR,CAMILA,ERRIN) 0.35 MG tablet; Take 1 tablet (0.35 mg total) by mouth daily.  Patient will also organize her next annual gynecologic exam as soon as possible.   Counseling on above issues and coordination of care more than 50% for 30 minutes.  Genia Del MD, 10:40 AM 09/06/2018

## 2018-09-08 ENCOUNTER — Encounter: Payer: Self-pay | Admitting: Obstetrics & Gynecology

## 2018-09-08 NOTE — Patient Instructions (Signed)
1. Menorrhagia with irregular cycle Oligo-menorrhagia probably associated with Peri-menopause.  Rule out endometrial pathology with a pelvic ultrasound.  Rule out thyroid dysfunction and treatment as patient is on Armour Thyroid.  Rule out anemia.  Decision to attempt control with the progestin only birth control pill.  Usage reviewed and prescription sent to pharmacy. - CBC - TSH - US Transvaginal Non-OB; Future  2. Perimenopause Probable perimenopausal, will rule out other causes of Oligo-menorrhagia.  3. Acquired hypothyroidism On Armour thyroid, will check TSH today. - TSH  Other orders - norethindrone (MICRONOR,CAMILA,ERRIN) 0.35 MG tablet; Take 1 tablet (0.35 mg total) by mouth daily.  Patient will also organize her next annual gynecologic exam as soon as possible.  Paige Bowen, it was a pleasure meeting you today!  I will inform you of your results as soon as they are available.

## 2018-09-09 ENCOUNTER — Other Ambulatory Visit: Payer: Self-pay | Admitting: *Deleted

## 2018-09-09 ENCOUNTER — Encounter: Payer: Self-pay | Admitting: *Deleted

## 2018-09-09 DIAGNOSIS — R7989 Other specified abnormal findings of blood chemistry: Secondary | ICD-10-CM

## 2018-09-09 MED ORDER — THYROID 30 MG PO TABS: 30.0000 mg | ORAL_TABLET | Freq: Every day | ORAL | 0 refills | Status: DC

## 2018-09-10 DIAGNOSIS — R5383 Other fatigue: Secondary | ICD-10-CM | POA: Diagnosis not present

## 2018-09-10 DIAGNOSIS — N924 Excessive bleeding in the premenopausal period: Secondary | ICD-10-CM | POA: Diagnosis not present

## 2018-09-11 ENCOUNTER — Encounter: Payer: Self-pay | Admitting: Internal Medicine

## 2018-09-11 ENCOUNTER — Ambulatory Visit (INDEPENDENT_AMBULATORY_CARE_PROVIDER_SITE_OTHER): Payer: 59 | Admitting: Internal Medicine

## 2018-09-11 ENCOUNTER — Other Ambulatory Visit: Payer: Self-pay

## 2018-09-11 VITALS — BP 104/60 | HR 74 | Temp 98.5°F | Ht 67.0 in | Wt 142.8 lb

## 2018-09-11 DIAGNOSIS — J45991 Cough variant asthma: Secondary | ICD-10-CM

## 2018-09-11 MED FILL — LITHIUM ER 450 MG TABLET: 450 | 30 days supply | Qty: 60 | Fill #0

## 2018-09-11 MED FILL — PROGESTERONE 200 MG CAPSULE: 200 | 30 days supply | Qty: 30 | Fill #0

## 2018-09-11 NOTE — Assessment & Plan Note (Addendum)
Rhinitis onset p arrived in [place] Feb 2019  Cough Onset abrupt 04/24/18  p drank hot coffee - FENO 06/17/2018  =   30 - Allergy profile 06/17/2018 >  Eos 0.2 /  IgE  53 RAST pos cat dog tee drust and mold  - cyclical cough regimen 06/17/2018  - rec maint on singulair 07/01/2018 and continue protonix x 3 months   Better to her satisfaction x with exposure to cats and just pnds with that so no evidence of asthma, this is just uacs >>>>  so could probably try off ppi and just use pepcid or zantac to transition off and see if cough flares, the reverse of a therapeutic trial.    Each maintenance medication was reviewed in detail including most importantly the difference between maintenance and as needed and under what circumstances the prns are to be used.  Please see AVS for specific  Instructions which are unique to this visit and I personally typed out  which were reviewed in detail in writing with the patient and a copy provided.       F/u can be q 6 m at this point.

## 2018-09-11 NOTE — Patient Instructions (Addendum)
No change in medications   Please schedule a follow up visit in 6  months but call sooner if needed  Late Add: tary  off ppi to pepcid x one month then off pepcid as well

## 2018-09-11 NOTE — Progress Notes (Signed)
Paige Bowen, female    DOB: April 04, 1971,     MRN: [id number]     Brief patient profile:  48 yo asian female never smoker with new onset daily cough since 05/04/18 referred to pulmonary clinic 06/17/2018 by Dr   Karilyn Cota   H/o peanut anaphylaxis mid 60s and h/o itchy eyes with pollen, nose running, sneezing but not coughing or wheezing >>> allergy eval age 54 but nothing rec benadryl and epi pen     History of Present Illness  06/17/2018  Pulmonary/ 1st office eval/Chyanna Flock  Chief Complaint  Patient presents with  . Pulmonary Consult    Referred by Dr. Karilyn Cota.  Pt c/o cough since 04/24/18.  Cough is non prod. She is worse when she is exposed to cats and dogs.   arrived in [place] around feb 2019 lived in [place] and then moved into house Feb 07 2018 but w/in a month of living condo noted itchy eyes/ sneezing rx clariton and zyrtec and bad allergy season then bad dry cough 04/24/18 p Choked on hot coffee and rx tessalon and depomedrol shot and started on singulair plus prednisone rx and mucinex dx and albuterol transiently felt felt fine but worse w/in 2 week of finishing prednisone sp referred to pulmonary clinic 06/17/2018 by Dr   Kevan Rosebush Reflux v Neurogenic Cough Differentiator Reflux Comments  Do you awaken from a sound sleep coughing violently?                            With trouble breathing? No , maybe at onset    Do you have choking episodes when you cannot  Get enough air, gasping for air ?              Yes   Do you usually cough when you lie down into  The bed, or when you just lie down to rest ?                          Sometimes    Do you usually cough after meals or eating?         no   Do you cough when (or after) you bend over?    Yes   GERD SCORE     Kouffman Reflux v Neurogenic Cough Differentiator Neurogenic   Do you more-or-less cough all day long? sporadic   Does change of temperature make you cough? Yes, cold   Does laughing or chuckling cause you to cough?  Yes def   Do fumes (perfume, automobile fumes, burned  Toast, etc.,) cause you to cough ?      perfume   Does speaking, singing, or talking on the phone cause you to cough   ?               Yes    Neurogenic/Airway score    Not limited by breathing from desired activities  rec The key to effective treatment for your cough is eliminating the non-stop cycle of cough you're stuck in long enough to let your airway heal completely and then see if there is anything still making you cough once you stop the cough suppression, but this should take no more than 5 days to figure out. First take tessalon 200 mg up to every 6 hours and supplement if needed with  Tylenol #3   up to 1 every 4 hours to suppress the  urge to cough at all or even clear your throat. Swallowing water or using ice chips/non mint and menthol containing candies (such as lifesavers or sugarless jolly ranchers) are also effective.  You should rest your voice and avoid activities that you know make you cough. Once you have eliminated the cough for 3 straight days try reducing the tylenol #3  first,  then the delsym as tolerated.   For drainage / throat tickle try take CHLORPHENIRAMINE  4 mg (clortabs at [facility]) - take one every 4 hours as needed - available over the counter- may cause drowsiness so start with just a bedtime dose or two and see how you tolerate it before trying in daytime   Protonix (pantoprazole) Take 30-60 min before first meal of the day and Pepcid 20 mg one bedtime    until returns  completely gone for at least a week without the need for cough suppression GERD diet   Depomedrol 120 mg IM and stay on singulair 10 mg daily (montelukast)  Please remember to go to the lab department   for your tests - we will call you with the results when they are available.    Please schedule a follow up office visit in 2 weeks, sooner if needed  with all medications /inhalers/ solutions in hand so we can verify exactly what you are  taking. This includes all medications from all doctors and over the counters     07/01/2018  f/u ov/Darious Rehman re: cough x 04/2018  Chief Complaint  Patient presents with  . Follow-up    Cough has resolved. She has had slight hoarseness over the past 2 days.   Dyspnea:   Not limited by breathing from desired activities  // hiking one day prior to OV  [place] Cough: gone for now / no longer needing tessalon Sleeping: ok now SABA use: has alb not using  Some hoarseness and sense of nasal congestion pnds around cats Has been prescribed singulair but not using consistently  rec Singulair 10 mg one daily in evening /nights  Stay on pantoprazole 40 mg  Take 30-60 min before first meal of the day  For drainage / throat tickle try take CHLORPHENIRAMINE  4 mg - take one every 4 hours as needed - For cough >  Use tessalon 200 mg up three times daily as needed  GERD  Diet  Please schedule a follow up visit in 2 months but call sooner if needed      09/11/2018  f/u ov/Honesty Menta re:  uacs much better vs baseline  Chief Complaint  Patient presents with  . Follow-up    Breathing is overall doing well. She has occ cough- non prod.    Dyspnea:  Not limited by breathing from desired activities   Cough: just pnds worse with cats  Sleeping: ok  SABA use: no need      No obvious day to day or daytime variability or assoc excess/ purulent sputum or mucus plugs or hemoptysis or cp or chest tightness, subjective wheeze or overt sinus or hb symptoms.   Sleeping ok  without nocturnal  or early am exacerbation  of respiratory  c/o's or need for noct saba. Also denies any obvious fluctuation of symptoms with weather or environmental changes or other aggravating or alleviating factors except as outlined above   No unusual exposure hx or h/o childhood pna/ asthma or knowledge of premature birth.  Current Allergies, Complete Past Medical History, Past Surgical History, Family History, and  Social History were  reviewed in [employer] [facility] record.  ROS  The following are not active complaints unless bolded Hoarseness, sore throat, dysphagia, dental problems, itching, sneezing,  nasal congestion or discharge of excess mucus or purulent secretions, ear ache,   fever, chills, sweats, unintended wt loss or wt gain, classically pleuritic or exertional cp,  orthopnea pnd or arm/hand swelling  or leg swelling, presyncope, palpitations, abdominal pain, anorexia, nausea, vomiting, diarrhea  or change in bowel habits or change in bladder habits, change in stools or change in urine, dysuria, hematuria,  rash, arthralgias, visual complaints, headache, numbness, weakness or ataxia or problems with walking or coordination,  change in mood or  memory.        Current Meds  Medication Sig  . benzonatate (TESSALON) 200 MG capsule Take 1 capsule (200 mg total) by mouth 3 (three) times daily as needed for cough.  . lithium carbonate (ESKALITH) 450 MG CR tablet Take 900 mg by mouth at bedtime.  Marland Kitchen MAGNESIUM PO Take by mouth daily.  . montelukast (SINGULAIR) 10 MG tablet Take 1 tablet (10 mg total) by mouth at bedtime.  . NP THYROID 90 MG tablet Take 1 tablet by mouth daily.  . pantoprazole (PROTONIX) 40 MG tablet Take 1 tablet (40 mg total) by mouth daily. Take 30-60 min before first meal of the day  . traZODone (DESYREL) 50 MG tablet 1/4 tablet at bedtime as needed                 Objective:       amb asian female   09/11/2018       142   07/01/18 141 lb (64 kg)  06/17/18 139 lb 6.4 oz (63.2 kg)     Vital signs reviewed - Note on arrival 02 sats  99% on RA     HEENT: nl dentition, turbinates bilaterally, and oropharynx. Nl external ear canals without cough reflex   NECK :  without JVD/Nodes/TM/ nl carotid upstrokes bilaterally   LUNGS: no acc muscle use,  Nl contour chest which is clear to A and P bilaterally without cough on insp or exp maneuvers   CV:  RRR  no s3 or murmur or  increase in P2, and no edema   ABD:  soft and nontender with nl inspiratory excursion in the supine position. No bruits or organomegaly appreciated, bowel sounds nl  MS:  Nl gait/ ext warm without deformities, calf tenderness, cyanosis or clubbing No obvious joint restrictions   SKIN: warm and dry without lesions    NEURO:  alert, approp, nl sensorium with  no motor or cerebellar deficits apparent.        Assessment

## 2018-09-12 ENCOUNTER — Telehealth: Payer: Self-pay | Admitting: *Deleted

## 2018-09-12 ENCOUNTER — Encounter: Payer: Self-pay | Admitting: *Deleted

## 2018-09-12 NOTE — Telephone Encounter (Signed)
-----   Message from Nyoka Cowden, MD sent at 09/11/2018  8:13 PM EDT ----- After review of records should be ok to try off protonix after this bottle complete and do pepcid 20 mg bid x one week then just hs x one week then try off and resume the ppi if flares.

## 2018-09-12 NOTE — Telephone Encounter (Signed)
mychart msg sent her pt request

## 2018-09-20 DIAGNOSIS — F319 Bipolar disorder, unspecified: Secondary | ICD-10-CM | POA: Diagnosis not present

## 2018-09-20 DIAGNOSIS — F411 Generalized anxiety disorder: Secondary | ICD-10-CM | POA: Diagnosis not present

## 2018-09-24 ENCOUNTER — Other Ambulatory Visit: Payer: Self-pay

## 2018-09-26 ENCOUNTER — Ambulatory Visit (INDEPENDENT_AMBULATORY_CARE_PROVIDER_SITE_OTHER): Payer: 59 | Admitting: Obstetrics & Gynecology

## 2018-09-26 ENCOUNTER — Encounter: Payer: Self-pay | Admitting: Obstetrics & Gynecology

## 2018-09-26 ENCOUNTER — Other Ambulatory Visit: Payer: Self-pay

## 2018-09-26 ENCOUNTER — Ambulatory Visit (INDEPENDENT_AMBULATORY_CARE_PROVIDER_SITE_OTHER): Payer: 59

## 2018-09-26 DIAGNOSIS — Z124 Encounter for screening for malignant neoplasm of cervix: Secondary | ICD-10-CM

## 2018-09-26 DIAGNOSIS — Z1151 Encounter for screening for human papillomavirus (HPV): Secondary | ICD-10-CM

## 2018-09-26 DIAGNOSIS — N921 Excessive and frequent menstruation with irregular cycle: Secondary | ICD-10-CM

## 2018-09-26 DIAGNOSIS — Z01419 Encounter for gynecological examination (general) (routine) without abnormal findings: Secondary | ICD-10-CM | POA: Diagnosis not present

## 2018-09-26 NOTE — Patient Instructions (Signed)
1. Encounter for routine gynecological examination with Papanicolaou smear of cervix Normal gynecologic exam.  Pap test with high-risk HPV done today.  Breast exam normal.  Will schedule screening mammogram.  Health labs with family physician.  Recommend aerobic physical activities 5 times a week and weightlifting every 2 days.  2. Menometrorrhagia Pelvic ultrasound today completely reassuring with a thin endometrial lining at 1.8 mm.  Uterus and ovaries normal.  Recommend continuing micronized progesterone daily.  3. Screening for malignant neoplasm of cervix - PAP,TP IMGw/HPV RNA,rflx HPVTYPE16,18/45  4. Special screening examination for human papillomavirus (HPV) - PAP,TP IMGw/HPV RNA,rflx [id number],63/87  Mariann, it was a pleasure seeing you today!  I will inform you of your results as soon as they are available.

## 2018-09-26 NOTE — Progress Notes (Signed)
Paige Bowen 1971/04/09 [id number]   History:    48 y.o. G0 Boyfriend in [place]  RP:  Established patient presenting for annual gyn exam and Pelvic US  HPI: Oligo-Menometrorrhagia with Pelvic US today to investigate. Taking Micronized Progesterone 100 mg HS daily instead of the Progestin-only pill.  Not bleeding currently.  No pelvic pain.  No pain with IC, uses [place].  Urine normal, but mild SUI, will start Kegels.  BMs normal.  Breasts normal.  BMI Normal.  Needs to increase physical activity.  Health labs with Fam MD.   Past medical history,surgical history, family history and social history were all reviewed and documented in the EPIC chart.  Gynecologic History Patient's last menstrual period was 09/04/2018. Contraception: condoms Last Pap:  Previous H/O LEEP x 2, last one >20 yrs ago.  Will obtain records Last mammogram: Schedule screening Mammo Bone Density: Never Colonoscopy: Never  Obstetric History OB History  Gravida Para Term Preterm AB Living  0 0 0 0 0 0  SAB TAB Ectopic Multiple Live Births  0 0 0 0 0     ROS: A ROS was performed and pertinent positives and negatives are included in the history.  GENERAL: No fevers or chills. HEENT: No change in vision, no earache, sore throat or sinus congestion. NECK: No pain or stiffness. CARDIOVASCULAR: No chest pain or pressure. No palpitations. PULMONARY: No shortness of breath, cough or wheeze. GASTROINTESTINAL: No abdominal pain, nausea, vomiting or diarrhea, melena or bright red blood per rectum. GENITOURINARY: No urinary frequency, urgency, hesitancy or dysuria. MUSCULOSKELETAL: No joint or muscle pain, no back pain, no recent trauma. DERMATOLOGIC: No rash, no itching, no lesions. ENDOCRINE: No polyuria, polydipsia, no heat or cold intolerance. No recent change in weight. HEMATOLOGICAL: No anemia or easy bruising or bleeding. NEUROLOGIC: No headache, seizures, numbness, tingling or weakness. PSYCHIATRIC: No depression, no  loss of interest in normal activity or change in sleep pattern.     Exam:   LMP 09/04/2018   There is no height or weight on file to calculate BMI.  General appearance : Well developed well nourished female. No acute distress HEENT: Eyes: no retinal hemorrhage or exudates,  Neck supple, trachea midline, no carotid bruits, no thyroidmegaly Lungs: Clear to auscultation, no rhonchi or wheezes, or rib retractions  Heart: Regular rate and rhythm, no murmurs or gallops Breast:Examined in sitting and supine position were symmetrical in appearance, no palpable masses or tenderness,  no skin retraction, no nipple inversion, no nipple discharge, no skin discoloration, no axillary or supraclavicular lymphadenopathy Abdomen: no palpable masses or tenderness, no rebound or guarding Extremities: no edema or skin discoloration or tenderness  Pelvic: Vulva: Normal             Vagina: No gross lesions or discharge  Cervix: No gross lesions or discharge.  Pap/HPV HR done  Uterus  AV, normal size, shape and consistency, non-tender and mobile  Adnexa  Without masses or tenderness  Anus: Normal  Pelvic U S today: T/V images.  Anteverted uterus normal size and shape with no mass measuring 6.89 x 4.53 x 3.17 cm.  Endometrial lining thin and symmetrical, with no mass, measured at 1.8 mm.  Ovaries normal in size with a 1.0 cm resolving corpus luteum cyst on the left side (cycle day #20).  No adnexal mass seen.  No free fluid in the posterior cul-de-sac.   Assessment/Plan:  48 y.o. female for annual exam   1. Encounter for routine gynecological examination with  Papanicolaou smear of cervix Normal gynecologic exam.  Pap test with high-risk HPV done today.  Breast exam normal.  Will schedule screening mammogram.  Health labs with family physician.  Recommend aerobic physical activities 5 times a week and weightlifting every 2 days.  2. Menometrorrhagia Pelvic ultrasound today completely reassuring with a thin  endometrial lining at 1.8 mm.  Uterus and ovaries normal.  Recommend continuing micronized progesterone daily.  3. Screening for malignant neoplasm of cervix - PAP,TP IMGw/HPV RNA,rflx HPVTYPE16,18/45  4. Special screening examination for human papillomavirus (HPV) - PAP,TP IMGw/HPV RNA,rflx HPVTYPE16,18/45  Counseling on above issues and coordination of care more than 50% for 10 minutes.  Genia Del MD, 9:53 AM 09/26/2018

## 2018-09-27 LAB — PAP, TP IMAGING W/ HPV RNA, RFLX HPV TYPE 16,18/45: HPV DNA High Risk: NOT DETECTED

## 2018-09-30 ENCOUNTER — Encounter: Payer: Self-pay | Admitting: *Deleted

## 2018-09-30 DIAGNOSIS — F319 Bipolar disorder, unspecified: Secondary | ICD-10-CM | POA: Diagnosis not present

## 2018-09-30 DIAGNOSIS — F411 Generalized anxiety disorder: Secondary | ICD-10-CM | POA: Diagnosis not present

## 2018-10-16 DIAGNOSIS — F319 Bipolar disorder, unspecified: Secondary | ICD-10-CM | POA: Diagnosis not present

## 2018-10-16 DIAGNOSIS — F411 Generalized anxiety disorder: Secondary | ICD-10-CM | POA: Diagnosis not present

## 2018-10-17 ENCOUNTER — Ambulatory Visit: Payer: Self-pay | Admitting: Obstetrics & Gynecology

## 2018-10-23 ENCOUNTER — Encounter (INDEPENDENT_AMBULATORY_CARE_PROVIDER_SITE_OTHER): Payer: 59 | Admitting: Internal Medicine

## 2018-10-23 DIAGNOSIS — R5383 Other fatigue: Secondary | ICD-10-CM | POA: Diagnosis not present

## 2018-10-23 DIAGNOSIS — R6882 Decreased libido: Secondary | ICD-10-CM | POA: Diagnosis not present

## 2018-10-23 DIAGNOSIS — E559 Vitamin D deficiency, unspecified: Secondary | ICD-10-CM | POA: Diagnosis not present

## 2018-10-23 DIAGNOSIS — F3178 Bipolar disorder, in full remission, most recent episode mixed: Secondary | ICD-10-CM | POA: Diagnosis not present

## 2018-10-23 MED FILL — PROGESTERONE 200 MG CAPSULE: 200 | 30 days supply | Qty: 30 | Fill #0

## 2018-10-24 MED FILL — MONTELUKAST SOD 10 MG TAB: 10 | 30 days supply | Qty: 30 | Fill #0

## 2018-10-28 DIAGNOSIS — F411 Generalized anxiety disorder: Secondary | ICD-10-CM | POA: Diagnosis not present

## 2018-10-28 DIAGNOSIS — F319 Bipolar disorder, unspecified: Secondary | ICD-10-CM | POA: Diagnosis not present

## 2018-11-13 DIAGNOSIS — F319 Bipolar disorder, unspecified: Secondary | ICD-10-CM | POA: Diagnosis not present

## 2018-11-15 MED FILL — LITHIUM ER 450 MG TABLET: 450 | 30 days supply | Qty: 60 | Fill #0

## 2018-11-18 DIAGNOSIS — F319 Bipolar disorder, unspecified: Secondary | ICD-10-CM | POA: Diagnosis not present

## 2018-12-02 MED FILL — PROGESTERONE 200 MG CAPSULE: 200 | 30 days supply | Qty: 30 | Fill #1

## 2018-12-02 MED FILL — MONTELUKAST SOD 10 MG TAB: 10 | 30 days supply | Qty: 30 | Fill #1

## 2018-12-04 DIAGNOSIS — F319 Bipolar disorder, unspecified: Secondary | ICD-10-CM | POA: Diagnosis not present

## 2018-12-10 DIAGNOSIS — F3162 Bipolar disorder, current episode mixed, moderate: Secondary | ICD-10-CM | POA: Diagnosis not present

## 2018-12-11 DIAGNOSIS — F3162 Bipolar disorder, current episode mixed, moderate: Secondary | ICD-10-CM | POA: Diagnosis not present

## 2018-12-16 DIAGNOSIS — F319 Bipolar disorder, unspecified: Secondary | ICD-10-CM | POA: Diagnosis not present

## 2018-12-16 DIAGNOSIS — F1011 Alcohol abuse, in remission: Secondary | ICD-10-CM | POA: Diagnosis not present

## 2018-12-17 MED FILL — NP THYROID 90 MG TABLET: 90 | 90 days supply | Qty: 90 | Fill #1

## 2018-12-24 MED FILL — LITHIUM ER 450 MG TABLET: 450 | 30 days supply | Qty: 60 | Fill #0

## 2018-12-25 DIAGNOSIS — F319 Bipolar disorder, unspecified: Secondary | ICD-10-CM | POA: Diagnosis not present

## 2018-12-25 DIAGNOSIS — F1011 Alcohol abuse, in remission: Secondary | ICD-10-CM | POA: Diagnosis not present

## 2018-12-26 DIAGNOSIS — F3181 Bipolar II disorder: Secondary | ICD-10-CM | POA: Diagnosis not present

## 2018-12-26 MED FILL — MONTELUKAST SOD 10 MG TAB: 10 | 30 days supply | Qty: 30 | Fill #0

## 2018-12-26 MED FILL — PROGESTERONE MICRONIZED 200: 200 | 30 days supply | Qty: 30 | Fill #0

## 2018-12-31 DIAGNOSIS — F319 Bipolar disorder, unspecified: Secondary | ICD-10-CM | POA: Diagnosis not present

## 2018-12-31 DIAGNOSIS — F1011 Alcohol abuse, in remission: Secondary | ICD-10-CM | POA: Diagnosis not present

## 2019-01-01 DIAGNOSIS — F1011 Alcohol abuse, in remission: Secondary | ICD-10-CM | POA: Diagnosis not present

## 2019-01-01 DIAGNOSIS — F319 Bipolar disorder, unspecified: Secondary | ICD-10-CM | POA: Diagnosis not present

## 2019-01-06 DIAGNOSIS — F3178 Bipolar disorder, in full remission, most recent episode mixed: Secondary | ICD-10-CM | POA: Diagnosis not present

## 2019-01-06 DIAGNOSIS — R5383 Other fatigue: Secondary | ICD-10-CM | POA: Diagnosis not present

## 2019-01-07 DIAGNOSIS — F319 Bipolar disorder, unspecified: Secondary | ICD-10-CM | POA: Diagnosis not present

## 2019-01-07 DIAGNOSIS — F1011 Alcohol abuse, in remission: Secondary | ICD-10-CM | POA: Diagnosis not present

## 2019-01-07 MED FILL — DIVALPROEX SOD DR 250 MG TA: 250 | 30 days supply | Qty: 60 | Fill #0

## 2019-01-14 DIAGNOSIS — F1011 Alcohol abuse, in remission: Secondary | ICD-10-CM | POA: Diagnosis not present

## 2019-01-14 DIAGNOSIS — F319 Bipolar disorder, unspecified: Secondary | ICD-10-CM | POA: Diagnosis not present

## 2019-01-17 ENCOUNTER — Telehealth: Payer: Self-pay | Admitting: *Deleted

## 2019-01-17 ENCOUNTER — Other Ambulatory Visit: Payer: Self-pay | Admitting: Internal Medicine

## 2019-01-17 DIAGNOSIS — J45991 Cough variant asthma: Secondary | ICD-10-CM

## 2019-01-17 MED ORDER — BENZONATATE 200 MG PO CAPS: 200.0000 mg | ORAL_CAPSULE | Freq: Three times a day (TID) | ORAL | 1 refills | Status: DC | PRN

## 2019-01-17 MED FILL — BENZONATATE 200 MG CAP: 200 | 10 days supply | Qty: 30 | Fill #0

## 2019-01-17 NOTE — Telephone Encounter (Signed)
-----   Message from Tanda Rockers, MD sent at 01/17/2019 10:24 AM EDT ----- Ov next avail f/u cough

## 2019-01-17 NOTE — Progress Notes (Signed)
Coughing again, needs tessalon refill and ov with all meds in hand using a trust but verify approach to confirm accurate Medication  Reconciliation The principal here is that until we are certain that the  patients are doing what we've asked, it makes no sense to ask them to do more.

## 2019-01-17 NOTE — Telephone Encounter (Signed)
Spoke with the pt  She prefers video visit  Appt scheduled for 4:15 01/20/2019

## 2019-01-20 ENCOUNTER — Telehealth (INDEPENDENT_AMBULATORY_CARE_PROVIDER_SITE_OTHER): Payer: 59 | Admitting: Internal Medicine

## 2019-01-20 DIAGNOSIS — J45991 Cough variant asthma: Secondary | ICD-10-CM | POA: Diagnosis not present

## 2019-01-20 MED ORDER — PANTOPRAZOLE SODIUM 40 MG PO TBEC: 40.0000 mg | DELAYED_RELEASE_TABLET | Freq: Every day | ORAL | 2 refills | Status: DC

## 2019-01-20 MED ORDER — FAMOTIDINE 20 MG PO TABS: ORAL_TABLET | ORAL | 11 refills | Status: DC

## 2019-01-20 MED ORDER — ACETAMINOPHEN-CODEINE #3 300-30 MG PO TABS: 1.0000 | ORAL_TABLET | ORAL | 0 refills | Status: DC | PRN

## 2019-01-20 MED ORDER — ACETAMINOPHEN-CODEINE #3 300-30 MG PO TABS: 1.0000 | ORAL_TABLET | ORAL | 0 refills | Status: AC | PRN

## 2019-01-20 MED FILL — PANTOPRAZOLE SOD DR 40 MG T: 40 | 30 days supply | Qty: 30 | Fill #0

## 2019-01-20 MED FILL — FAMOTIDINE 20 MG TABS: 20 | 30 days supply | Qty: 30 | Fill #0

## 2019-01-20 MED FILL — ACETAMINOPHEN/COD #3 TABLET: 300-30 | 5 days supply | Qty: 30 | Fill #0

## 2019-01-20 NOTE — Progress Notes (Addendum)
Paige Bowen, female    DOB: [birth year],     MRN: [id number]     Brief patient profile:  48 yo asian female never smoker with new onset daily cough since 05/04/18 referred to pulmonary clinic 06/17/2018 by Dr   Anastasio Champion   H/o peanut anaphylaxis mid 71s and h/o itchy eyes with pollen, nose running, sneezing but not coughing or wheezing >>> allergy eval age 52 but nothing rec benadryl and epi pen     History of Present Illness  06/17/2018  Pulmonary/ 1st office eval/Paige Bowen  Chief Complaint  Patient presents with   Pulmonary Consult    Referred by Dr. Anastasio Champion.  Pt c/o cough since 04/24/18.  Cough is non prod. She is worse when she is exposed to cats and dogs.   arrived in [place] around feb 2019 lived in [place] and then moved into house Feb 07 2018 but w/in a month of living condo noted itchy eyes/ sneezing rx clariton and zyrtec and bad allergy season then bad dry cough 04/24/18 p Choked on hot coffee and rx tessalon and depomedrol shot and started on singulair plus prednisone rx and mucinex dx and albuterol transiently felt felt fine but worse w/in 2 week of finishing prednisone sp referred to pulmonary clinic 06/17/2018 by Dr   Feliciana Rossetti Reflux v Neurogenic Cough Differentiator Reflux Comments  Do you awaken from a sound sleep coughing violently?                            With trouble breathing? No , maybe at onset    Do you have choking episodes when you cannot  Get enough air, gasping for air ?              Yes   Do you usually cough when you lie down into  The bed, or when you just lie down to rest ?                          Sometimes    Do you usually cough after meals or eating?         no   Do you cough when (or after) you bend over?    Yes   GERD SCORE     Kouffman Reflux v Neurogenic Cough Differentiator Neurogenic   Do you more-or-less cough all day long? sporadic   Does change of temperature make you cough? Yes, cold   Does laughing or chuckling cause you to cough?  Yes def   Do fumes (perfume, automobile fumes, burned  Toast, etc.,) cause you to cough ?      perfume   Does speaking, singing, or talking on the phone cause you to cough   ?               Yes    Neurogenic/Airway score    Not limited by breathing from desired activities  rec The key to effective treatment for your cough is eliminating the non-stop cycle of cough you're stuck in long enough to let your airway heal completely and then see if there is anything still making you cough once you stop the cough suppression, but this should take no more than 5 days to figure out. First take tessalon 200 mg up to every 6 hours and supplement if needed with  Tylenol #3   up to 1 every 4 hours to suppress the  urge to cough at all or even clear your throat. Swallowing water or using ice chips/non mint and menthol containing candies (such as lifesavers or sugarless jolly ranchers) are also effective.  You should rest your voice and avoid activities that you know make you cough. Once you have eliminated the cough for 3 straight days try reducing the tylenol #3  first,  then the delsym as tolerated.   For drainage / throat tickle try take CHLORPHENIRAMINE  4 mg (clortabs at [facility]) - take one every 4 hours as needed - available over the counter- may cause drowsiness so start with just a bedtime dose or two and see how you tolerate it before trying in daytime   Protonix (pantoprazole) Take 30-60 min before first meal of the day and Pepcid 20 mg one bedtime    until returns  completely gone for at least a week without the need for cough suppression GERD diet   Depomedrol 120 mg IM and stay on singulair 10 mg daily (montelukast)  Please remember to go to the lab department   for your tests - we will call you with the results when they are available. Please schedule a follow up office visit in 2 weeks, sooner if needed  with all medications /inhalers/ solutions in hand so we can verify exactly what you are taking.  This includes all medications from all doctors and over the counters     07/01/2018  f/u ov/Paige Bowen re: cough x 04/2018  Chief Complaint  Patient presents with   Follow-up    Cough has resolved. She has had slight hoarseness over the past 2 days.   Dyspnea:   Not limited by breathing from desired activities  // hiking one day prior to OV  [place] Cough: gone for now / no longer needing tessalon Sleeping: ok now SABA use: has alb not using  Some hoarseness and sense of nasal congestion pnds around cats Has been prescribed singulair but not using consistently  rec Singulair 10 mg one daily in evening /nights  Stay on pantoprazole 40 mg  Take 30-60 min before first meal of the day  For drainage / throat tickle try take CHLORPHENIRAMINE  4 mg - take one every 4 hours as needed - For cough >  Use tessalon 200 mg up three times daily as needed  GERD  Diet  Please schedule a follow up visit in 2 months but call sooner if needed      09/11/2018  f/u ov/Paige Bowen re:  uacs much better vs baseline  Chief Complaint  Patient presents with   Follow-up    Breathing is overall doing well. She has occ cough- non prod.    Dyspnea:  Not limited by breathing from desired activities   Cough: just pnds worse with cats  Sleeping: ok  SABA use: no need  rec Try off acid suppression    Acute Virtual Visit via Elink Note 01/20/2019 re recurrent cough/ same pattern  I connected with Paige Bowen on 01/20/19 at  4:15 PM EDT by telephone and verified that I am speaking with the correct person using two identifiers.   I discussed the limitations, risks, security and privacy concerns of performing an evaluation and management service by telephone and the availability of in person appointments. I also discussed with the patient that there may be a patient responsible charge related to this service. The patient expressed understanding and agreed to proceed.   History of Present Illness: Cough: 2 weeks onset  dry while on maint  singulair  Daytime worse with voice use s antecedent uri symptoms or sob  / wheeze         Sleeping: no flares of cough / wheeze SABA use: none 02: none   No obvious patterns in  day to day or daytime variability or assoc excess/ purulent sputum or mucus plugs or hemoptysis or cp or chest tightness, subjective wheeze or overt sinus or hb symptoms.    Also denies any obvious fluctuation of symptoms with weather or environmental changes or other aggravating or alleviating factors except as outlined above.   Meds reviewed/ med reconciliation completed     Current Meds  Medication Sig   benzonatate (TESSALON) 200 MG capsule Take 1 capsule (200 mg total) by mouth 3 (three) times daily as needed for cough.   divalproex (DEPAKOTE) 250 MG DR tablet Take 250 mg by mouth 2 (two) times daily.   montelukast (SINGULAIR) 10 MG tablet Take 1 tablet (10 mg total) by mouth at bedtime.   NP THYROID 90 MG tablet Take 1 tablet by mouth daily.   traZODone (DESYREL) 50 MG tablet 1-2 tablet at bedtime as needed         Observations/Objective: Good phonation harsh spont dry sounding cough provoked by speaking or laughing   Assessment and Plan: See problem list for active a/p's   Follow Up Instructions: See avs for instructions unique to this ov which includes revised/ updated med list     I discussed the assessment and treatment plan with the patient. The patient was provided an opportunity to ask questions and all were answered. The patient agreed with the plan and demonstrated an understanding of the instructions.   The patient was advised to call back or seek an in-person evaluation if the symptoms worsen or if the condition fails to improve as anticipated.  I provided 25 minutes of non-face-to-face time during this encounter.   Sandrea HughsMichael Krissi Willaims, MD

## 2019-01-20 NOTE — Patient Instructions (Signed)
Take delsym two tsp every 12 hours and supplement if needed with  Tylenol #3   up to 1-2 every 4 hours to suppress the urge to cough. Swallowing water and/or using ice chips/non mint and menthol containing candies (such as lifesavers or sugarless jolly ranchers) are also effective.  You should rest your voice and avoid activities that you know make you cough.  Once you have eliminated the cough for 3 straight days try reducing the Tylenol #3 first,  then the delsym as tolerated.      Pantoprazole (protonix) 40 mg   Take  30-60 min before first meal of the day and Pepcid (famotidine)  20 mg one @  bedtime until return to office - this is the best way to tell whether stomach acid is contributing to your problem.    For drainage / throat tickle try take CHLORPHENIRAMINE  4 mg  (Chlortab 4mg   at [facility] should be easiest to find in the green box)  take one every 4 hours as needed - available over the counter- may cause drowsiness so start with just a bedtime dose or two and see how you tolerate it before trying in daytime    Continue singulair also   Come in for Depomedrol 120 mg IM by end of the week if not better

## 2019-01-21 ENCOUNTER — Encounter: Payer: Self-pay | Admitting: Internal Medicine

## 2019-01-21 NOTE — Assessment & Plan Note (Signed)
Rhinitis onset p arrived in [place] Feb 2019  Cough Onset abrupt 04/24/18  p drank hot coffee - FENO 06/17/2018  =   30 - Allergy profile 06/17/2018 >  Eos 0.2 /  IgE  53 RAST pos cat dog tree drust and mold  - cyclical cough regimen 09/62/8366  - rec maint on singulair 07/01/2018 and continue protonix x 3 months - flared mid July 2020 off gerd rx/on singulair   Of the three most common causes of  Sub-acute / recurrent or chronic cough, only one (GERD)  can actually contribute to/ trigger  the other two (asthma and post nasal drip syndrome)  and perpetuate the cylce of cough.  While not intuitively obvious, many patients with chronic low grade reflux do not cough until there is a primary insult that disturbs the protective epithelial barrier and exposes sensitive nerve endings.   This is typically viral but can due to PNDS and  either may apply here.   The point is that once this occurs, it is difficult to eliminate the cycle  using anything but a maximally effective acid suppression regimen at least in the short run, accompanied by an appropriate diet to address non acid GERD and control / eliminate the cough itself for at least 3 days with Tyl #3 and if not better add short course of steroids or Depomedrol 120 mg IM (prefer latter  She is bipolar)  I had an extended discussion with the patient reviewing all relevant studies completed to date and  lasting 15 to 20 minutes of a 25 minute acute virtual office  visit    Each maintenance medication was reviewed in detail including most importantly the difference between maintenance and prns and under what circumstances the prns are to be triggered using an action plan format that is not reflected in the computer generated alphabetically organized AVS.     Please see AVS for specific instructions unique to this visit that I personally wrote and verbalized to the the pt in detail and then reviewed with pt  by my nurse highlighting any  changes in therapy  recommended at today's visit to their plan of care.

## 2019-01-22 DIAGNOSIS — B9721 SARS-associated coronavirus as the cause of diseases classified elsewhere: Secondary | ICD-10-CM | POA: Diagnosis not present

## 2019-01-22 DIAGNOSIS — R05 Cough: Secondary | ICD-10-CM | POA: Diagnosis not present

## 2019-01-22 DIAGNOSIS — F3181 Bipolar II disorder: Secondary | ICD-10-CM | POA: Diagnosis not present

## 2019-01-22 DIAGNOSIS — R5383 Other fatigue: Secondary | ICD-10-CM | POA: Diagnosis not present

## 2019-01-22 MED FILL — traZODone HCL 150 MG TABS: 150 | 30 days supply | Qty: 30 | Fill #0

## 2019-01-22 MED FILL — QUEtiapine FUMARATE ER 50 M: 50 | 30 days supply | Qty: 30 | Fill #0

## 2019-01-23 ENCOUNTER — Telehealth: Payer: Self-pay | Admitting: Internal Medicine

## 2019-01-23 NOTE — Telephone Encounter (Signed)
Patient is returning phone call.  Patient phone number is [phone].

## 2019-01-23 NOTE — Telephone Encounter (Signed)
Attempted to call pt but unable to reach. Left message for pt to return call. 

## 2019-01-23 NOTE — Telephone Encounter (Signed)
Patient returned call  She is asking if she should go ahead and reduce her Tylenol #3 that was rx'd at the 8.3.2020 video visit.  Patient stated that her cough has been completely eliminated since first taking the Tylenol #3 and did begin reducing it today because she needed to stand on a step-stool/ladder to clean a glass chandelier > she has taken the Tylenol #3 3-4 times today.    Patient also mentioned that while her cough is completely gone, she is experiencing some hoarseness/loss of voice.  She attributes this to lack of sleep this week with only about 3 hours on Monday night and Tuesday night.  Denies any sore throat today, PND.    Patient did have her antibody test done by PCP and was informed today this was negative.  Patient requests call back today. Dr Melvyn Novas please advise, thank you.

## 2019-01-23 NOTE — Telephone Encounter (Signed)
Discussed with pt, nothing else needed for now - has tessalon she can use now that severe cyclical cough has been eliminated

## 2019-01-28 DIAGNOSIS — R5383 Other fatigue: Secondary | ICD-10-CM | POA: Diagnosis not present

## 2019-01-28 DIAGNOSIS — F3178 Bipolar disorder, in full remission, most recent episode mixed: Secondary | ICD-10-CM | POA: Diagnosis not present

## 2019-01-28 DIAGNOSIS — F1011 Alcohol abuse, in remission: Secondary | ICD-10-CM | POA: Diagnosis not present

## 2019-01-28 DIAGNOSIS — F319 Bipolar disorder, unspecified: Secondary | ICD-10-CM | POA: Diagnosis not present

## 2019-01-28 DIAGNOSIS — Z5181 Encounter for therapeutic drug level monitoring: Secondary | ICD-10-CM | POA: Diagnosis not present

## 2019-01-28 MED FILL — PROGESTERONE 200 MG CAPSULE: 200 | 30 days supply | Qty: 60 | Fill #0

## 2019-02-04 DIAGNOSIS — F1011 Alcohol abuse, in remission: Secondary | ICD-10-CM | POA: Diagnosis not present

## 2019-02-04 DIAGNOSIS — F3181 Bipolar II disorder: Secondary | ICD-10-CM | POA: Diagnosis not present

## 2019-02-04 DIAGNOSIS — F319 Bipolar disorder, unspecified: Secondary | ICD-10-CM | POA: Diagnosis not present

## 2019-02-05 DIAGNOSIS — Z7251 High risk heterosexual behavior: Secondary | ICD-10-CM | POA: Diagnosis not present

## 2019-02-05 DIAGNOSIS — N912 Amenorrhea, unspecified: Secondary | ICD-10-CM | POA: Diagnosis not present

## 2019-02-06 ENCOUNTER — Telehealth: Payer: Self-pay | Admitting: Internal Medicine

## 2019-02-06 MED FILL — DIVALPROEX SOD DR 250 MG TA: 250 | 30 days supply | Qty: 60 | Fill #1

## 2019-02-06 MED FILL — MONTELUKAST SOD 10 MG TAB: 10 | 30 days supply | Qty: 30 | Fill #0

## 2019-02-06 NOTE — Telephone Encounter (Signed)
Nothing else to rec, if not better ov with all meds in hand

## 2019-02-06 NOTE — Telephone Encounter (Signed)
Pt returning call.  Pt has a meeting at 10:30 until 11:30.

## 2019-02-06 NOTE — Telephone Encounter (Signed)
Pt called back states she actually hasnt been on the Singulair for the last 1-2 weeks. Advised pt to start taking the Singulair as prescribed. Pt verbalized understanding.   Will forward this back to MW to see if he has any other recs for pt.

## 2019-02-06 NOTE — Telephone Encounter (Signed)
lmtcb for pt.  

## 2019-02-06 NOTE — Telephone Encounter (Signed)
Called and spoke to pt. Pt c/o slight increase in SOB, prod cough with clear mucus x 4-5 days. Pt states she has been crying (personal reasons) a lot which makes the congestion worse. Pt states to calm down she walks in the woods but has a grass and pollen allergy. Pt understands this not the best way to cope as this may be contributing to her congestion. Pt states she isnt taking anything OTC but is taking her Singulair daily. Pt denies CP/tightness, f/c/s. Pt requesting this be answered today.   Dr. Melvyn Novas please advise. Thanks.

## 2019-02-06 NOTE — Telephone Encounter (Signed)
Called and spoke to pt. Informed her of the recs per MW. Pt verbalized understanding and denied any further questions or concerns at this time.   

## 2019-02-11 DIAGNOSIS — F319 Bipolar disorder, unspecified: Secondary | ICD-10-CM | POA: Diagnosis not present

## 2019-02-11 DIAGNOSIS — F1011 Alcohol abuse, in remission: Secondary | ICD-10-CM | POA: Diagnosis not present

## 2019-02-12 ENCOUNTER — Telehealth: Payer: Self-pay | Admitting: Internal Medicine

## 2019-02-12 NOTE — Telephone Encounter (Signed)
Spoke with patient. She stated that she needed to have a note sent to [employer] stating that she was out of work. When I asked the patient which dates she needed to have on the letter, she stated that she could not remember because she took some "mental health" days at the same time. Advised her that we needed the actual dates that she was out of work for breathing issues. She stated that she was out of work on August 8/3, 8/4 and 8/5. She stated that [employer] had faxed over forms. I don't see any documentation of this.   She stated that she needed the forms to faxed back in tomorrow. Advised her that I need to check with MW before generating a letter.   She will need to have this letter faxed to [employer].   MW, please advise.

## 2019-02-13 ENCOUNTER — Encounter: Payer: Self-pay | Admitting: *Deleted

## 2019-02-13 MED FILL — FAMOTIDINE 20 MG TABLET: 20 | 30 days supply | Qty: 30 | Fill #1

## 2019-02-13 MED FILL — PANTOPRAZOLE SOD DR 40 MG T: 40 | 30 days supply | Qty: 30 | Fill #1

## 2019-02-13 NOTE — Telephone Encounter (Signed)
I am in hospital rounding today but will come by at lunch to complete paperwork if forms available on my desk, if not then  If no forms available can just state she was out of work due to  cough on the dates she listed and also for all ov's both in person an on televisits.(include the dates of those encounters)

## 2019-02-13 NOTE — Telephone Encounter (Signed)
Still have not received any sort of forms on this pt  I created a letter and faxed to [employer]  [facility]

## 2019-02-13 NOTE — Progress Notes (Signed)
Virtual Visit via Telephone Note  I connected with Paige Bowen on 02/14/19 at  9:30 AM EDT by telephone and verified that I am speaking with the correct person using two identifiers.  Location: Patient: Home Provider: [facility] - [address], [place], [place], [place] [address]   I discussed the limitations, risks, security and privacy concerns of performing an evaluation and management service by telephone and the availability of in person appointments. I also discussed with the patient that there may be a patient responsible charge related to this service. The patient expressed understanding and agreed to proceed.  Patient consented to consult via telephone: Yes People present and their role in pt care: Pt   History of Present Illness:  48 year old female followed in our office for cough variant asthma  Past medical history: Depression, Anxiety  Smoking history: Never Smoker Maintenance: None Patient of Dr. Melvyn Novas  Chief complaint: Cough Follow up    48 year old female never smoker completing a tele-visit with her office today for follow-up of upper airway cough syndrome.  Patient reports that cough did improve after last interventions by Dr. Melvyn Novas.  She reports that it has unfortunately though come back and feels that it is lingering.  Patient admits that she has been through a lot of emotional stress recently and recently was transitioned from lithium to Depakote.  She also has had emotional stress.  She has been outside more recently to try to help with the emotional stress and she feels that she may have worsened allergies.  Patient tries to do nasal saline rinses but sometimes she forgets.  Patient does use Tessalon 3 times a week to help with cough.  Unfortunately Tessalon does make her drowsy.  See cough ROS listed below:  02/14/2019 - Cough ROS:   When to the symptoms start: Initially treated November/2019, July 2020 How are you today: better but  lingering   Have you had fever/sore throat (first 5 to 7 days of URI) or Have you had cough/nasal congestion (10 to 14 days of URI) : none Have you used anything to treat the cough, as anything improved : Tessalon did help, Singulair, Tylenol 3, avoiding known triggers Is it a dry or wet cough: dry cough Does the cough happen when your breathing or when you breathe out: out  Other any triggers to your cough, or any aggravating factors: caffeine was trigger, pt has reduced, around friends with dogs  Daily antihistamine: no  GERD treatment: Protonix, Pepcid Singulair: yes  Cough checklist (bolded indicates presence):  Adherence, acid reflux, ACE inhibitor, active sinus disease, active smoking, adverse effects of medications (amiodarone/Macrodantin/bb), alpha 1, allergies, aspiration, anxiety, bronchiectasis, congestive heart failure (diastolic)   Observations/Objective:  09/06/2018-TSH- 0.07  06/17/2018-respiratory allergy panel- multiple elevations, IgE 53 >>>Largest elevations being in: Cat dander, allergen a alternada, dog dander, common silver birch, oak  06/17/2018-CBC with differential-eosinophils relative 4.2, eosinophils absolute 0.2  05/14/2018-chest x-ray-no edema or consolidation  Assessment and Plan:  Cough variant asthma vs UACS Plan: Patient needs to start daily antihistamine Continue Singulair Start nasal saline rinses 2 times daily If patient remains with our practice based off of her new insurance that will start in a month I believe patient would benefit from pulmonary function testing If cough remains over the next couple weeks then patient does need chest x-ray   Follow Up Instructions:  Return in about 2 months (around 04/16/2019), or if symptoms worsen or fail to improve, for Follow up with Dr.  Wert.    I discussed the assessment and treatment plan with the patient. The patient was provided an opportunity to ask questions and all were answered. The  patient agreed with the plan and demonstrated an understanding of the instructions.   The patient was advised to call back or seek an in-person evaluation if the symptoms worsen or if the condition fails to improve as anticipated.  I provided 23 minutes of non-face-to-face time during this encounter.   Coral CeoBrian P Mack, NP

## 2019-02-13 NOTE — Telephone Encounter (Signed)
Pt is returning call [phone].

## 2019-02-13 NOTE — Telephone Encounter (Signed)
Pt returned call. I advised her we have not received her FMLA forms but Magda Paganini did send a letter excusing the days. Pt states she was irritated by the issue with her FMLA and is 'wiping my hands of this'. Pt also states since she started her Singulair back on 8/20 she is still some SOB. Pts requesting an appt. Appt made with Aaron Edelman on 8/28 as a video visit. Pt verbalized understanding and denied any further questions or concerns at this time.

## 2019-02-13 NOTE — Telephone Encounter (Signed)
lmtcb for pt.  

## 2019-02-14 ENCOUNTER — Encounter: Payer: Self-pay | Admitting: Pulmonary Disease

## 2019-02-14 ENCOUNTER — Other Ambulatory Visit: Payer: Self-pay

## 2019-02-14 ENCOUNTER — Ambulatory Visit (INDEPENDENT_AMBULATORY_CARE_PROVIDER_SITE_OTHER): Payer: 59 | Admitting: Pulmonary Disease

## 2019-02-14 DIAGNOSIS — J45991 Cough variant asthma: Secondary | ICD-10-CM | POA: Diagnosis not present

## 2019-02-14 NOTE — Assessment & Plan Note (Signed)
Plan: Patient needs to start daily antihistamine Continue Singulair Start nasal saline rinses 2 times daily If patient remains with our practice based off of her new insurance that will start in a month I believe patient would benefit from pulmonary function testing If cough remains over the next couple weeks then patient does need chest x-ray

## 2019-02-14 NOTE — Patient Instructions (Addendum)
You were seen today by Paige Rinne, NP  for:   1. Cough variant asthma vs UACS  Please start taking a daily antihistamine:  >>>choose one of: zyrtec, claritin, allegra, or xyzal  >>>these are over the counter medications  >>>can choose generic option  >>>take daily  >>>this medication helps with allergies, post nasal drip, and cough   Start nasal saline rinses twice daily  Continue Singulair  Protonix 40 mg tablet  >>>Please take 1 tablet daily 15 minutes to 30 minutes before your first meal of the day as well as before your other medications >>>Try to take at the same time each day >>>take this medication daily  Continue Pepcid at night  GERD management: >>>Avoid laying flat until 2 hours after meals >>>Elevate head of the bed including entire chest >>>Reduce size of meals and amount of fat, acid, spices, caffeine and sweets >>>If you are smoking, Please stop! >>>Decrease alcohol consumption >>>Work on maintaining a healthy weight with normal BMI   Can continue to use Delsym as needed for cough management  If cough persist towards the end of September likely need to consider repeat chest x-ray as well as pulmonary function testing  Allergy Info:  Local pollens: Tree pollen-seen more in spring Grass pollen-seen more than summertime Weeds-seen more in fall  Perennial allergens:  Dust mite, cockroach, mold indoor, animal dander  Mold-seen more mid summer and fall    Follow Up:    Return in about 2 months (around 04/16/2019), or if symptoms worsen or fail to improve, for Follow up with Dr. Melvyn Novas.   Please do your part to reduce the spread of COVID-19:      Reduce your risk of any infection  and COVID19 by using the similar precautions used for avoiding the common cold or flu:  Marland Kitchen Wash your hands often with soap and warm water for at least 20 seconds.  If soap and water are not readily available, use an alcohol-based hand sanitizer with at least 60% alcohol.  .  If coughing or sneezing, cover your mouth and nose by coughing or sneezing into the elbow areas of your shirt or coat, into a tissue or into your sleeve (not your hands). Langley Gauss A MASK when in public  . Avoid shaking hands with others and consider head nods or verbal greetings only. . Avoid touching your eyes, nose, or mouth with unwashed hands.  . Avoid close contact with people who are sick. . Avoid places or events with large numbers of people in one location, like concerts or sporting events. . If you have some symptoms but not all symptoms, continue to monitor at home and seek medical attention if your symptoms worsen. . If you are having a medical emergency, call 911.   [place] / e-Visit: [email or web address]         [facility]: [facility]: [phone]                   [facility]: [phone]     It is flu season:   >>> Best ways to protect herself from the flu: Receive the yearly flu vaccine, practice good hand hygiene washing with soap and also using hand sanitizer when available, eat a nutritious meals, get adequate rest, hydrate appropriately   Please contact the office if your symptoms worsen or you have concerns that you are not improving.   Thank you  for choosing [facility] for your healthcare, and for allowing [place] to partner with you on your healthcare journey. I am thankful to be able to provide care to you today.   Paige Quaker FNP-C

## 2019-02-15 NOTE — Progress Notes (Signed)
Chart and office note reviewed in detail  > agree with a/p as outlined    

## 2019-02-18 DIAGNOSIS — F319 Bipolar disorder, unspecified: Secondary | ICD-10-CM | POA: Diagnosis not present

## 2019-02-18 DIAGNOSIS — F1011 Alcohol abuse, in remission: Secondary | ICD-10-CM | POA: Diagnosis not present

## 2019-02-18 MED FILL — QUEtiapine FUMARATE ER 50 M: 50 | 30 days supply | Qty: 30 | Fill #1

## 2019-02-18 MED FILL — traZODone HCL 150 MG TABS: 150 | 30 days supply | Qty: 30 | Fill #1

## 2019-02-25 DIAGNOSIS — F411 Generalized anxiety disorder: Secondary | ICD-10-CM | POA: Diagnosis not present

## 2019-02-25 DIAGNOSIS — F1011 Alcohol abuse, in remission: Secondary | ICD-10-CM | POA: Diagnosis not present

## 2019-02-25 DIAGNOSIS — F319 Bipolar disorder, unspecified: Secondary | ICD-10-CM | POA: Diagnosis not present

## 2019-02-25 DIAGNOSIS — F3181 Bipolar II disorder: Secondary | ICD-10-CM | POA: Diagnosis not present

## 2019-02-26 MED FILL — DIVALPROEX SOD DR 250 MG TA: 250 | 30 days supply | Qty: 90 | Fill #0

## 2019-02-28 MED FILL — PROGESTERONE 200 MG CAPSULE: 200 | 30 days supply | Qty: 60 | Fill #1

## 2019-02-28 MED FILL — MONTELUKAST SOD 10 MG TAB: 10 | 30 days supply | Qty: 30 | Fill #1

## 2019-03-04 DIAGNOSIS — F319 Bipolar disorder, unspecified: Secondary | ICD-10-CM | POA: Diagnosis not present

## 2019-03-04 DIAGNOSIS — F1011 Alcohol abuse, in remission: Secondary | ICD-10-CM | POA: Diagnosis not present

## 2019-03-10 ENCOUNTER — Other Ambulatory Visit: Payer: Self-pay

## 2019-03-10 ENCOUNTER — Ambulatory Visit (INDEPENDENT_AMBULATORY_CARE_PROVIDER_SITE_OTHER): Payer: Self-pay | Admitting: Internal Medicine

## 2019-03-10 ENCOUNTER — Encounter: Payer: Self-pay | Admitting: Internal Medicine

## 2019-03-10 DIAGNOSIS — J45991 Cough variant asthma: Secondary | ICD-10-CM

## 2019-03-10 DIAGNOSIS — F411 Generalized anxiety disorder: Secondary | ICD-10-CM | POA: Diagnosis not present

## 2019-03-10 DIAGNOSIS — F3131 Bipolar disorder, current episode depressed, mild: Secondary | ICD-10-CM | POA: Diagnosis not present

## 2019-03-10 NOTE — Patient Instructions (Signed)
No change in recommendations   Please schedule a follow up visit in 4  months but call sooner if needed

## 2019-03-10 NOTE — Assessment & Plan Note (Addendum)
Rhinitis onset p arrived in [place] Feb 2019  Cough Onset abrupt 04/24/18  p drank hot coffee - FENO 06/17/2018  =   30 - Allergy profile 06/17/2018 >  Eos 0.2 /  IgE  53 RAST pos cat dog tree dust and mold  - cyclical cough regimen 37/90/2409  - rec maint on singulair 07/01/2018 and continue protonix x 3 months - flared mid July 2020 off gerd rx/on singulair > resume gerd rx/ antihistamines/ netti pot - 03/10/2019 cough improved to pt's satisfaction    No changes needed for now > f/u p 06/20/19   Each maintenance medication was reviewed in detail including most importantly the difference between maintenance and as needed and under what circumstances the prns are to be used.  Please see AVS for specific  Instructions which are unique to this visit and I personally typed out  which were reviewed in detail over the phone with the patient and a copy provided via MyChart

## 2019-03-10 NOTE — Progress Notes (Signed)
Paige Bowen, female    DOB: 1970/08/10      MRN: [id number]     Brief patient profile:  48 yo asian female never smoker with new onset daily cough since 05/04/18 referred to pulmonary clinic 06/17/2018 by Dr   Paige Bowen   H/o peanut anaphylaxis mid 51s and h/o itchy eyes with pollen, nose running, sneezing but not coughing or wheezing >>> allergy eval age 54 but nothing rec benadryl and epi pen     History of Present Illness  06/17/2018  Pulmonary/ 1st office eval/Paige Bowen  Chief Complaint  Patient presents with  . Pulmonary Consult    Referred by Dr. Karilyn Bowen.  Pt c/o cough since 04/24/18.  Cough is non prod. She is worse when she is exposed to cats and dogs.   arrived in [place] around feb 2019 lived in [place] and then moved into house Feb 07 2018 but w/in a month of living condo noted itchy eyes/ sneezing rx clariton and zyrtec and bad allergy season then bad dry cough 04/24/18 p Choked on hot coffee and rx tessalon and depomedrol shot and started on singulair plus prednisone rx and mucinex dx and albuterol transiently felt felt fine but worse w/in 2 week of finishing prednisone sp referred to pulmonary clinic 06/17/2018 by Dr   Paige Bowen Reflux v Neurogenic Cough Differentiator Reflux Comments  Do you awaken from a sound sleep coughing violently?                            With trouble breathing? No , maybe at onset    Do you have choking episodes when you cannot  Get enough air, gasping for air ?              Yes   Do you usually cough when you lie down into  The bed, or when you just lie down to rest ?                          Sometimes    Do you usually cough after meals or eating?         no   Do you cough when (or after) you bend over?    Yes   GERD SCORE     Kouffman Reflux v Neurogenic Cough Differentiator Neurogenic   Do you more-or-less cough all day long? sporadic   Does change of temperature make you cough? Yes, cold   Does laughing or chuckling cause you to cough?  Yes def   Do fumes (perfume, automobile fumes, burned  Toast, etc.,) cause you to cough ?      perfume   Does speaking, singing, or talking on the phone cause you to cough   ?               Yes    Neurogenic/Airway score    Not limited by breathing from desired activities  rec The key to effective treatment for your cough is eliminating the non-stop cycle of cough you're stuck in long enough to let your airway heal completely and then see if there is anything still making you cough once you stop the cough suppression, but this should take no more than 5 days to figure out. First take tessalon 200 mg up to every 6 hours and supplement if needed with  Tylenol #3   up to 1 every 4 hours to suppress  the urge to cough at all or even clear your throat. Swallowing water or using ice chips/non mint and menthol containing candies (such as lifesavers or sugarless jolly ranchers) are also effective.  You should rest your voice and avoid activities that you know make you cough. Once you have eliminated the cough for 3 straight days try reducing the tylenol #3  first,  then the delsym as tolerated.   For drainage / throat tickle try take CHLORPHENIRAMINE  4 mg ([facility] - take one every 4 hours as needed - available over the counter- may cause drowsiness so start with just a bedtime dose or two and see how you tolerate it before trying in daytime   Protonix (pantoprazole) Take 30-60 min before first meal of the day and Pepcid 20 mg one bedtime    until returns  completely gone for at least a week without the need for cough suppression GERD diet   Depomedrol 120 mg IM and stay on singulair 10 mg daily (montelukast)  Please remember to go to the lab department   for your tests - we will call you with the results when they are available. Please schedule a follow up office visit in 2 weeks, sooner if needed  with all medications /inhalers/ solutions in hand so we can verify exactly what you are taking.  This includes all medications from all doctors and over the counters     07/01/2018  f/u ov/Paige Bowen re: cough x 04/2018  Chief Complaint  Patient presents with  . Follow-up    Cough has resolved. She has had slight hoarseness over the past 2 days.   Dyspnea:   Not limited by breathing from desired activities  // hiking one day prior to OV  [place] Cough: gone for now / no longer needing tessalon Sleeping: ok now SABA use: has alb not using  Some hoarseness and sense of nasal congestion pnds around cats Has been prescribed singulair but not using consistently  rec Singulair 10 mg one daily in evening /nights  Stay on pantoprazole 40 mg  Take 30-60 min before first meal of the day  For drainage / throat tickle try take CHLORPHENIRAMINE  4 mg - take one every 4 hours as needed - For cough >  Use tessalon 200 mg up three times daily as needed  GERD  Diet  Please schedule a follow up visit in 2 months but call sooner if needed      09/11/2018  f/u ov/Paige Bowen re:  uacs much better vs baseline  Chief Complaint  Patient presents with  . Follow-up    Breathing is overall doing well. She has occ cough- non prod.    Dyspnea:  Not limited by breathing from desired activities   Cough: just pnds worse with cats  Sleeping: ok  SABA use: no need  rec Try off acid suppression    Acute Virtual Visit via [place] Note 01/20/2019 re recurrent cough/ same pattern  I connected with Paige Bowen on 01/20/19 at  4:15 PM EDT by telephone and verified that I am speaking with the correct person using two identifiers.   I discussed the limitations, risks, security and privacy concerns of performing an evaluation and management service by telephone and the availability of in person appointments. I also discussed with the patient that there may be a patient responsible charge related to this service. The patient expressed understanding and agreed to proceed.   History of Present Illness: Cough: 2 weeks  onset  dry while on maint  singulair  Daytime worse with voice use s antecedent uri symptoms or sob  / wheeze         Sleeping: no flares of cough / wheeze SABA use: none 02: none   No obvious patterns in  day to day or daytime variability or assoc excess/ purulent sputum or mucus plugs or hemoptysis or cp or chest tightness, subjective wheeze or overt sinus or hb symptoms.    Also denies any obvious fluctuation of symptoms with weather or environmental changes or other aggravating or alleviating factors except as outlined above.   Meds reviewed/ med reconciliation completed     Current Meds  Medication Sig  . benzonatate (TESSALON) 200 MG capsule Take 1 capsule (200 mg total) by mouth 3 (three) times daily as needed for cough.  . divalproex (DEPAKOTE) 250 MG DR tablet Take 250 mg by mouth 2 (two) times daily.  . montelukast (SINGULAIR) 10 MG tablet Take 1 tablet (10 mg total) by mouth at bedtime.  . NP THYROID 90 MG tablet Take 1 tablet by mouth daily.  . traZODone (DESYREL) 50 MG tablet 1-2 tablet at bedtime as needed         Observations/Objective: Good phonation harsh spont dry sounding cough provoked by speaking or laughing rec Take delsym two tsp every 12 hours and supplement if needed with  Tylenol #3   up to 1-2 every 4 hours to suppress the urge to cough  Pantoprazole (protonix) 40 mg   Take  30-60 min before first meal of the day and Pepcid (famotidine)  20 mg one @  bedtime until return to office - this is the best way to tell whether stomach acid is contributing to your problem.   For drainage / throat tickle try take CHLORPHENIRAMINE  4 mg  > never able to find it Continue singulair also  Come in for Depomedrol 120 mg IM by end of the week if not better    Televisit NP  02/14/19  rec Please start taking a daily antihistamine:  >>>choose one of: zyrtec, claritin, allegra, or xyzal  >>>these are over the counter medications  >>>can choose generic option  >>>take daily   >>>this medication helps with allergies, post nasal drip, and cough   Start nasal saline rinses twice daily  Continue Singulair  Protonix 40 mg tablet  >>>Please take 1 tablet daily 15 minutes to 30 minutes before your first meal of the day >>>take this medication daily Continue Pepcid at night GERD management:    Virtual Visit via Telephone Note 03/10/2019   I connected with Paige NayPatricia Bowen on 03/10/19 at  3:00 PM EDT by telephone and verified that I am speaking with the correct person using two identifiers.   I discussed the limitations, risks, security and privacy concerns of performing an evaluation and management service by telephone and the availability of in person appointments. I also discussed with the patient that there may be a patient responsible charge related to this service. The patient expressed understanding and agreed to proceed.   History of Present Illness: Dyspnea:  Not limited by breathing from desired activities   Cough: tends to cough before supper/ netti pot seems to helps Sleeping: no trouble  SABA use: has not using  02: no   No obvious day to day or daytime variability or assoc excess/ purulent sputum or mucus plugs or hemoptysis or cp or chest tightness, subjective wheeze or overt sinus or hb symptoms.  Also denies any obvious fluctuation of symptoms with weather or environmental changes or other aggravating or alleviating factors except as outlined above.   Meds reviewed/ med reconciliation completed       Observations/Objective: Sounds great on the phone, good phonation/ no coughing   Assessment and Plan: See problem list for active a/p's   Follow Up Instructions: See avs for instructions unique to this ov which includes revised/ updated med list     I discussed the assessment and treatment plan with the patient. The patient was provided an opportunity to ask questions and all were answered. The patient agreed with the plan and  demonstrated an understanding of the instructions.   The patient was advised to call back or seek an in-person evaluation if the symptoms worsen or if the condition fails to improve as anticipated.  I provided 15  minutes of non-face-to-face time during this encounter.   Christinia Gully, MD

## 2019-03-11 DIAGNOSIS — F319 Bipolar disorder, unspecified: Secondary | ICD-10-CM | POA: Diagnosis not present

## 2019-03-11 DIAGNOSIS — F1011 Alcohol abuse, in remission: Secondary | ICD-10-CM | POA: Diagnosis not present

## 2019-03-17 DIAGNOSIS — F319 Bipolar disorder, unspecified: Secondary | ICD-10-CM | POA: Diagnosis not present

## 2019-03-17 DIAGNOSIS — F1011 Alcohol abuse, in remission: Secondary | ICD-10-CM | POA: Diagnosis not present

## 2019-03-18 DIAGNOSIS — R7989 Other specified abnormal findings of blood chemistry: Secondary | ICD-10-CM | POA: Diagnosis not present

## 2019-03-20 DIAGNOSIS — R7989 Other specified abnormal findings of blood chemistry: Secondary | ICD-10-CM | POA: Diagnosis not present

## 2019-03-24 ENCOUNTER — Other Ambulatory Visit (INDEPENDENT_AMBULATORY_CARE_PROVIDER_SITE_OTHER): Payer: Self-pay | Admitting: Internal Medicine

## 2019-03-24 ENCOUNTER — Telehealth (INDEPENDENT_AMBULATORY_CARE_PROVIDER_SITE_OTHER): Payer: Self-pay | Admitting: Internal Medicine

## 2019-03-24 DIAGNOSIS — Z1231 Encounter for screening mammogram for malignant neoplasm of breast: Secondary | ICD-10-CM

## 2019-03-24 MED ORDER — NP THYROID 90 MG PO TABS: 90.0000 mg | ORAL_TABLET | Freq: Every day | ORAL | 0 refills | Status: DC

## 2019-03-24 NOTE — Telephone Encounter (Signed)
I have ordered a screening mammogram for this patient Paige Bowen.

## 2019-03-24 NOTE — Telephone Encounter (Signed)
Fas order to [facility] dpt & scheduling. Pt request; sh is working there now.

## 2019-03-26 ENCOUNTER — Encounter (INDEPENDENT_AMBULATORY_CARE_PROVIDER_SITE_OTHER): Payer: Self-pay

## 2019-04-01 DIAGNOSIS — F1011 Alcohol abuse, in remission: Secondary | ICD-10-CM | POA: Diagnosis not present

## 2019-04-01 DIAGNOSIS — F319 Bipolar disorder, unspecified: Secondary | ICD-10-CM | POA: Diagnosis not present

## 2019-04-02 ENCOUNTER — Telehealth (INDEPENDENT_AMBULATORY_CARE_PROVIDER_SITE_OTHER): Payer: Self-pay

## 2019-04-02 DIAGNOSIS — Z1231 Encounter for screening mammogram for malignant neoplasm of breast: Secondary | ICD-10-CM | POA: Diagnosis not present

## 2019-04-02 DIAGNOSIS — N6459 Other signs and symptoms in breast: Secondary | ICD-10-CM | POA: Diagnosis not present

## 2019-04-02 DIAGNOSIS — N6489 Other specified disorders of breast: Secondary | ICD-10-CM | POA: Diagnosis not present

## 2019-04-02 NOTE — Telephone Encounter (Signed)
Dr Levonne Spiller Radiolgist called and stated Mr Paige Bowen will be doing a Biospy today for the pt today post mammogram. Just ot notify us of  information. If you have any questions or concerns you may contact at listed number above.

## 2019-04-03 ENCOUNTER — Other Ambulatory Visit (INDEPENDENT_AMBULATORY_CARE_PROVIDER_SITE_OTHER): Payer: Self-pay

## 2019-04-03 ENCOUNTER — Telehealth (INDEPENDENT_AMBULATORY_CARE_PROVIDER_SITE_OTHER): Payer: Self-pay | Admitting: Internal Medicine

## 2019-04-03 DIAGNOSIS — Z1231 Encounter for screening mammogram for malignant neoplasm of breast: Secondary | ICD-10-CM

## 2019-04-03 NOTE — Progress Notes (Signed)
[facility] CALLED NEEDING A ORDER PUT IN FOR THE EXTERNAL FOR MORE TESTING FOR ABNORMAL IMAGING.

## 2019-04-08 NOTE — Telephone Encounter (Signed)
done

## 2019-04-17 ENCOUNTER — Telehealth (INDEPENDENT_AMBULATORY_CARE_PROVIDER_SITE_OTHER): Payer: Self-pay

## 2019-04-17 DIAGNOSIS — N6325 Unspecified lump in the left breast, overlapping quadrants: Secondary | ICD-10-CM | POA: Diagnosis not present

## 2019-04-17 DIAGNOSIS — N6489 Other specified disorders of breast: Secondary | ICD-10-CM | POA: Diagnosis not present

## 2019-04-17 DIAGNOSIS — R928 Other abnormal and inconclusive findings on diagnostic imaging of breast: Secondary | ICD-10-CM | POA: Diagnosis not present

## 2019-04-17 NOTE — Telephone Encounter (Signed)
[facility] called today to get Auth # for MRI;pt got new insurance. Pt changes employment to [employer] from [employer]. We do not have updated insurance  information. We do not have information to give [facility]. [facility] will have to contact patient to get new insurance & prior auth from their finding on last testing at [facility] Radiology Dept. To completed to MRI.

## 2019-04-21 ENCOUNTER — Other Ambulatory Visit (INDEPENDENT_AMBULATORY_CARE_PROVIDER_SITE_OTHER): Payer: Self-pay | Admitting: Internal Medicine

## 2019-04-21 ENCOUNTER — Encounter (INDEPENDENT_AMBULATORY_CARE_PROVIDER_SITE_OTHER): Payer: Self-pay

## 2019-04-21 DIAGNOSIS — F319 Bipolar disorder, unspecified: Secondary | ICD-10-CM | POA: Diagnosis not present

## 2019-04-21 DIAGNOSIS — F1011 Alcohol abuse, in remission: Secondary | ICD-10-CM | POA: Diagnosis not present

## 2019-04-21 DIAGNOSIS — N6320 Unspecified lump in the left breast, unspecified quadrant: Secondary | ICD-10-CM

## 2019-04-22 DIAGNOSIS — F411 Generalized anxiety disorder: Secondary | ICD-10-CM | POA: Diagnosis not present

## 2019-04-22 DIAGNOSIS — F3181 Bipolar II disorder: Secondary | ICD-10-CM | POA: Diagnosis not present

## 2019-04-23 ENCOUNTER — Other Ambulatory Visit (INDEPENDENT_AMBULATORY_CARE_PROVIDER_SITE_OTHER): Payer: Self-pay | Admitting: Internal Medicine

## 2019-04-23 DIAGNOSIS — N6012 Diffuse cystic mastopathy of left breast: Secondary | ICD-10-CM | POA: Diagnosis not present

## 2019-04-23 DIAGNOSIS — R928 Other abnormal and inconclusive findings on diagnostic imaging of breast: Secondary | ICD-10-CM | POA: Diagnosis not present

## 2019-04-23 MED ORDER — NP THYROID 90 MG PO TABS: 90.0000 mg | ORAL_TABLET | Freq: Every day | ORAL | 0 refills | Status: DC

## 2019-04-28 DIAGNOSIS — F319 Bipolar disorder, unspecified: Secondary | ICD-10-CM | POA: Diagnosis not present

## 2019-04-28 DIAGNOSIS — F1011 Alcohol abuse, in remission: Secondary | ICD-10-CM | POA: Diagnosis not present

## 2019-05-19 ENCOUNTER — Other Ambulatory Visit: Payer: Self-pay

## 2019-05-19 DIAGNOSIS — Z20822 Contact with and (suspected) exposure to covid-19: Secondary | ICD-10-CM

## 2019-05-20 LAB — NOVEL CORONAVIRUS, NAA: SARS-CoV-2, NAA: NOT DETECTED

## 2019-05-26 ENCOUNTER — Ambulatory Visit (INDEPENDENT_AMBULATORY_CARE_PROVIDER_SITE_OTHER): Payer: Self-pay | Admitting: Internal Medicine

## 2019-05-26 ENCOUNTER — Encounter (INDEPENDENT_AMBULATORY_CARE_PROVIDER_SITE_OTHER): Payer: Self-pay | Admitting: Internal Medicine

## 2019-05-26 ENCOUNTER — Other Ambulatory Visit: Payer: Self-pay

## 2019-05-26 VITALS — BP 110/66 | HR 73 | Temp 97.6°F | Resp 18 | Ht 69.0 in | Wt 144.0 lb

## 2019-05-26 DIAGNOSIS — R5381 Other malaise: Secondary | ICD-10-CM

## 2019-05-26 DIAGNOSIS — R5383 Other fatigue: Secondary | ICD-10-CM

## 2019-05-26 DIAGNOSIS — N951 Menopausal and female climacteric states: Secondary | ICD-10-CM

## 2019-05-26 DIAGNOSIS — E559 Vitamin D deficiency, unspecified: Secondary | ICD-10-CM | POA: Diagnosis not present

## 2019-05-26 HISTORY — DX: Other fatigue: R53.81

## 2019-05-26 HISTORY — DX: Menopausal and female climacteric states: N95.1

## 2019-05-26 NOTE — Progress Notes (Signed)
Metrics: Intervention Frequency ACO  Documented Smoking Status Yearly  Screened one or more times in 24 months  Cessation Counseling or  Active cessation medication Past 24 months  Past 24 months   Guideline developer: UpToDate (See UpToDate for funding source) Date Released: 2014       Wellness Office Visit  Subjective:  Patient ID: Paige Bowen, female    DOB: June 17, 1971  Age: 48 y.o. MRN: [id number]  CC: This lady comes in for follow-up of perimenopausal symptoms, generalized malaise and fatigue, vitamin D deficiency. HPI  She is doing very well at the present time and surprisingly, with the death of her father recently, she seems to have achieved peace in her life and feels that she does not require therapy anymore. She continues on desiccated NP thyroid for symptoms of thyroid deficiency. She also continues on progesterone for perimenopausal symptoms. She continues to take vitamin D3 supplementation for vitamin D deficiency also. Past Medical History:  Diagnosis Date  . Bipolar 1 disorder ([place])   . Hormone disorder   . Malaise and fatigue 05/26/2019  . Perimenopause 05/26/2019      Family History  Problem Relation Age of Onset  . Heart disease Mother   . Hypertension Father   . Breast cancer Maternal Aunt   . 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   Social History   Tobacco Use  . Smoking status: Never Smoker  . Smokeless tobacco: Never Used  Substance Use Topics  . Alcohol use: Not Currently    Frequency: Never    Comment: hx of alchoholism     Current Meds  Medication Sig  . Ascorbic Acid (VITAMIN C) 1000 MG tablet Take 1,000 mg by mouth daily.  . cetirizine (ZYRTEC) 10 MG tablet Take 10 mg by mouth daily.  . Cholecalciferol (VITAMIN D3) 125 MCG (5000 UT) TABS Take 1 tablet by mouth daily.  .  divalproex (DEPAKOTE) 250 MG DR tablet Take 3 tablets by mouth daily.  . famotidine (PEPCID) 20 MG tablet Take 20 mg by mouth daily.   Marland Kitchen MAGNESIUM PO Take by mouth daily.  . montelukast (SINGULAIR) 10 MG tablet Take 10 mg by mouth daily.   . pantoprazole (PROTONIX) 40 MG tablet Take 1 tablet (40 mg total) by mouth daily. Take 30-60 min before first meal of the day  . progesterone (PROMETRIUM) 200 MG capsule Take 200 mg by mouth daily.   . QUEtiapine (SEROQUEL XR) 50 MG TB24 24 hr tablet 1 tablet (50 mg total) once daily  . thyroid (NP THYROID) 90 MG tablet Take 90 mg by mouth daily.   . traZODone (DESYREL) 150 MG tablet Take 150 mg by mouth at bedtime.   . [DISCONTINUED] divalproex (DEPAKOTE) 250 MG DR tablet Take 750 mg by mouth at bedtime.   . [DISCONTINUED] pantoprazole (PROTONIX) 40 MG tablet once daily      Bio Identical Hormones  This patient is being treated with desiccated thyroid, off label, for symptoms of thyroid deficiency.  The patient has been counseled regarding side effects and how to deal with them.  Objective:   Today's Vitals: BP 110/66 (BP Location: Right Arm, Patient Position: Sitting, Cuff Size: Small)   Pulse 73   Temp 97.6 F (36.4 C) (Temporal)   Resp 18   Ht 5' 9"  (1.753 m)   Wt 144 lb (  65.3 kg)   SpO2 98% Comment: wearing mask.  BMI 21.27 kg/m  Vitals with BMI 05/26/2019 09/11/2018 09/06/2018  Height 5' 9"  5' 7"  5' 7"   Weight 144 lbs 142 lbs 13 oz 140 lbs  BMI 21.26 37.04 88.89  Systolic 169 450 388  Diastolic 66 60 74  Pulse 73 74 -     Physical Exam   She looks systemically well.  Her weight is fairly stable.  No other new physical findings.    Assessment   1. Vitamin D deficiency disease   2. Perimenopause   3. Malaise and fatigue       Tests ordered Orders Placed This Encounter  Procedures  . CMP with eGFR([facility])  . Vitamin D, 25-hydroxy  . T3, Free  . TSH     Plan: 1. Blood work is ordered as above. 2. She will continue  with vitamin D3 supplementation for vitamin D deficiency. 3. She will continue with progesterone for her perimenopausal symptoms. 4. She will continue also with desiccated NP thyroid for her symptoms of thyroid deficiency. 5. Further recommendations will depend on blood results and I will see her in about 6 months time for annual physical exam.   No orders of the defined types were placed in this encounter.   Doree Albee, MD

## 2019-05-27 LAB — COMPLETE METABOLIC PANEL WITH GFR
AG Ratio: 1.6 (calc) (ref 1.0–2.5)
ALT: 9 U/L (ref 6–29)
AST: 14 U/L (ref 10–35)
Albumin: 3.9 g/dL (ref 3.6–5.1)
Alkaline phosphatase (APISO): 30 U/L — ABNORMAL LOW (ref 31–125)
BUN: 14 mg/dL (ref 7–25)
CO2: 27 mmol/L (ref 20–32)
Calcium: 9.3 mg/dL (ref 8.6–10.2)
Chloride: 106 mmol/L (ref 98–110)
Creat: 1.02 mg/dL (ref 0.50–1.10)
GFR, Est African American: 75 mL/min/{1.73_m2} (ref >=60)
GFR, Est Non African American: 65 mL/min/{1.73_m2} (ref >=60)
Globulin: 2.4 g/dL (calc) (ref 1.9–3.7)
Glucose, Bld: 79 mg/dL (ref 65–99)
Potassium: 4.2 mmol/L (ref 3.5–5.3)
Sodium: 139 mmol/L (ref 135–146)
Total Bilirubin: 0.5 mg/dL (ref 0.2–1.2)
Total Protein: 6.3 g/dL (ref 6.1–8.1)

## 2019-05-27 LAB — T3, FREE: T3, Free: 5 pg/mL — ABNORMAL HIGH (ref 2.3–4.2)

## 2019-05-27 LAB — VITAMIN D 25 HYDROXY (VIT D DEFICIENCY, FRACTURES): Vit D, 25-Hydroxy: 39 ng/mL (ref 30–100)

## 2019-05-27 LAB — TSH: TSH: 0.05 mIU/L — ABNORMAL LOW

## 2019-05-28 ENCOUNTER — Telehealth: Payer: Self-pay

## 2019-05-28 NOTE — Telephone Encounter (Signed)
I called patient about unread My Chart message from yesterday. Per DPR access note I read it to her in her voice mail. I asked her to call me back and let me know that she is okay with [place] proceeding with referral.  "Dr. Dellis Filbert commented on your lab result "TSH low. FT3 high. Overtreated Hypothyroidism. Refer to Endocrinologist."   If it is okay with you we will start the referral process to get you scheduled with an endocrinologist for evaluation of this."

## 2019-05-28 NOTE — Telephone Encounter (Signed)
Message left for patient to call me. See telephone encounter.

## 2019-06-03 DIAGNOSIS — H2513 Age-related nuclear cataract, bilateral: Secondary | ICD-10-CM | POA: Diagnosis not present

## 2019-06-03 DIAGNOSIS — H5213 Myopia, bilateral: Secondary | ICD-10-CM | POA: Diagnosis not present

## 2019-06-09 NOTE — Telephone Encounter (Signed)
My CHart email read by patient on 05/29/19.

## 2019-06-10 DIAGNOSIS — Z03818 Encounter for observation for suspected exposure to other biological agents ruled out: Secondary | ICD-10-CM | POA: Diagnosis not present

## 2019-06-10 DIAGNOSIS — Z20828 Contact with and (suspected) exposure to other viral communicable diseases: Secondary | ICD-10-CM | POA: Diagnosis not present

## 2019-06-24 ENCOUNTER — Telehealth: Payer: Self-pay | Admitting: Internal Medicine

## 2019-06-24 DIAGNOSIS — J45991 Cough variant asthma: Secondary | ICD-10-CM

## 2019-06-24 MED ORDER — FAMOTIDINE 20 MG PO TABS: 20.0000 mg | ORAL_TABLET | Freq: Every day | ORAL | 3 refills | Status: DC

## 2019-06-24 MED ORDER — MONTELUKAST SODIUM 10 MG PO TABS: 10.0000 mg | ORAL_TABLET | Freq: Every day | ORAL | 3 refills | Status: DC

## 2019-06-24 NOTE — Telephone Encounter (Signed)
Patient last seen 03/10/19 by Dr. Sherene Sires. Prescriptions sent to pharmacy. LVM for patient to advise. Nothing further needed.

## 2019-06-25 ENCOUNTER — Other Ambulatory Visit (INDEPENDENT_AMBULATORY_CARE_PROVIDER_SITE_OTHER): Payer: Self-pay | Admitting: Internal Medicine

## 2019-06-25 ENCOUNTER — Telehealth (INDEPENDENT_AMBULATORY_CARE_PROVIDER_SITE_OTHER): Payer: Self-pay

## 2019-06-25 DIAGNOSIS — J45991 Cough variant asthma: Secondary | ICD-10-CM

## 2019-06-25 MED ORDER — FAMOTIDINE 20 MG PO TABS: 20.0000 mg | ORAL_TABLET | Freq: Every day | ORAL | 0 refills | Status: DC

## 2019-06-25 MED ORDER — TRAZODONE HCL 150 MG PO TABS: 150.0000 mg | ORAL_TABLET | Freq: Every day | ORAL | 0 refills | Status: DC

## 2019-06-25 MED ORDER — DIVALPROEX SODIUM 250 MG PO DR TAB: 750.0000 mg | DELAYED_RELEASE_TABLET | Freq: Every day | ORAL | 0 refills | Status: DC

## 2019-06-25 MED ORDER — PANTOPRAZOLE SODIUM 40 MG PO TBEC: 40.0000 mg | DELAYED_RELEASE_TABLET | Freq: Every day | ORAL | 0 refills | Status: DC

## 2019-06-25 MED ORDER — PROGESTERONE MICRONIZED 200 MG PO CAPS: 200.0000 mg | ORAL_CAPSULE | Freq: Every day | ORAL | 0 refills | Status: DC

## 2019-06-25 MED ORDER — THYROID 90 MG PO TABS: 90.0000 mg | ORAL_TABLET | Freq: Every day | ORAL | 0 refills | Status: DC

## 2019-06-25 NOTE — Telephone Encounter (Signed)
-----   Message from Verlon Au sent at 06/25/2019  9:48 AM EST ----- Regarding: Needs you to call her back Patient called and left a VM wanting you to call her back concerning her medications.

## 2019-07-01 ENCOUNTER — Telehealth (INDEPENDENT_AMBULATORY_CARE_PROVIDER_SITE_OTHER): Payer: Self-pay

## 2019-07-03 NOTE — Telephone Encounter (Signed)
Return call to clarify all refills was made as 90 day supply. The only one that was given by Dr Sherene Sires for other medication. She would have to contact his office.

## 2019-07-22 DIAGNOSIS — F411 Generalized anxiety disorder: Secondary | ICD-10-CM | POA: Diagnosis not present

## 2019-07-22 DIAGNOSIS — F3181 Bipolar II disorder: Secondary | ICD-10-CM | POA: Diagnosis not present

## 2019-08-18 ENCOUNTER — Other Ambulatory Visit (INDEPENDENT_AMBULATORY_CARE_PROVIDER_SITE_OTHER): Payer: Self-pay | Admitting: Internal Medicine

## 2019-08-18 MED ORDER — PROGESTERONE MICRONIZED 200 MG PO CAPS: 200.0000 mg | ORAL_CAPSULE | Freq: Every day | ORAL | 0 refills | Status: DC

## 2019-09-29 ENCOUNTER — Other Ambulatory Visit: Payer: Self-pay

## 2019-09-30 ENCOUNTER — Encounter: Payer: Self-pay | Admitting: Obstetrics & Gynecology

## 2019-09-30 ENCOUNTER — Ambulatory Visit: Payer: BC Managed Care – PPO | Admitting: Obstetrics & Gynecology

## 2019-09-30 VITALS — BP 108/72 | Ht 67.5 in | Wt 144.4 lb

## 2019-09-30 DIAGNOSIS — Z1151 Encounter for screening for human papillomavirus (HPV): Secondary | ICD-10-CM

## 2019-09-30 DIAGNOSIS — Z01419 Encounter for gynecological examination (general) (routine) without abnormal findings: Secondary | ICD-10-CM | POA: Diagnosis not present

## 2019-09-30 DIAGNOSIS — N951 Menopausal and female climacteric states: Secondary | ICD-10-CM

## 2019-09-30 DIAGNOSIS — Z113 Encounter for screening for infections with a predominantly sexual mode of transmission: Secondary | ICD-10-CM

## 2019-09-30 MED ORDER — PROGESTERONE MICRONIZED 100 MG PO CAPS: 100.0000 mg | ORAL_CAPSULE | Freq: Every day | ORAL | 4 refills | Status: DC

## 2019-09-30 MED ORDER — PROGESTERONE 200 MG PO CAPS: 200.0000 mg | ORAL_CAPSULE | Freq: Every day | ORAL | 4 refills | Status: DC

## 2019-09-30 NOTE — Patient Instructions (Signed)
1. Encounter for routine gynecological examination with Papanicolaou smear of cervix Normal gynecologic exam.  Pap test with high-risk HPV done.  Breast exam normal.  Recent screening mammogram and diagnostic mammogram with ultrasound at [facility].  Scheduled for a repeat diagnostic mammogram and ultrasound in July 2021.  Health labs with family physician.  Will organize colonoscopy.  2. Screen for STD (sexually transmitted disease) .  Condom use recommended. - Gono-Chlam on Pap - HIV antibody (with reflex) - RPR - Hepatitis C Antibody - Hepatitis B Surface AntiGEN  3. Perimenopause Probably perimenopausal with last menstrual period around August/October 2020.  Vasomotor symptoms present, mainly hot flushes.  Will continue on progesterone 100 mg capsule at bedtime.  Patient informed that contraception is recommended up to 2 years into menopause.  Will call back to switch to the progestin pill if wishes to do so.  Other orders - progesterone (PROMETRIUM) 100 MG capsule; Take 1 capsule (100 mg total) by mouth at bedtime.  Kirsta, it was a pleasure seeing you today!  I will inform you of your results as soon as they are available.

## 2019-09-30 NOTE — Progress Notes (Signed)
Paige Bowen 18-Jul-1970 [id number]   History:    49 y.o. G0 New boyfriend  RP:  Established patient presenting for annual gyn exam  HPI: Perimenopause with LMP 01/2019.  Starting to have hot flushes. Taking Micronized Progesterone 100 mg HS daily at bedtime.  No pelvic pain.  No pain with IC, uses [place].  No vaginal d/c. Urine normal, no SUI or urgency.  Occasional urine incontinence at night since started on Trazadone.  BMs normal.  Breasts normal, but very dense.  Just had Dx Mammo/US at [facility].  BMI Normal at 22.28.  Hiking.  Health labs with Fam MD.   Past medical history,surgical history, family history and social history were all reviewed and documented in the EPIC chart.  Gynecologic History Patient's last menstrual period was 01/20/2019.  Obstetric History OB History  Gravida Para Term Preterm AB Living  0 0 0 0 0 0  SAB TAB Ectopic Multiple Live Births  0 0 0 0 0     ROS: A ROS was performed and pertinent positives and negatives are included in the history.  GENERAL: No fevers or chills. HEENT: No change in vision, no earache, sore throat or sinus congestion. NECK: No pain or stiffness. CARDIOVASCULAR: No chest pain or pressure. No palpitations. PULMONARY: No shortness of breath, cough or wheeze. GASTROINTESTINAL: No abdominal pain, nausea, vomiting or diarrhea, melena or bright red blood per rectum. GENITOURINARY: No urinary frequency, urgency, hesitancy or dysuria. MUSCULOSKELETAL: No joint or muscle pain, no back pain, no recent trauma. DERMATOLOGIC: No rash, no itching, no lesions. ENDOCRINE: No polyuria, polydipsia, no heat or cold intolerance. No recent change in weight. HEMATOLOGICAL: No anemia or easy bruising or bleeding. NEUROLOGIC: No headache, seizures, numbness, tingling or weakness. PSYCHIATRIC: No depression, no loss of interest in normal activity or change in sleep pattern.     Exam:   BP 108/72   Ht 5' 7.5" (1.715 m)   Wt 144 lb 6.4 oz (65.5 kg)   LMP  01/20/2019   BMI 22.28 kg/m   Body mass index is 22.28 kg/m.  General appearance : Well developed well nourished female. No acute distress HEENT: Eyes: no retinal hemorrhage or exudates,  Neck supple, trachea midline, no carotid bruits, no thyroidmegaly Lungs: Clear to auscultation, no rhonchi or wheezes, or rib retractions  Heart: Regular rate and rhythm, no murmurs or gallops Breast:Examined in sitting and supine position were symmetrical in appearance, no palpable masses or tenderness,  no skin retraction, no nipple inversion, no nipple discharge, no skin discoloration, no axillary or supraclavicular lymphadenopathy Abdomen: no palpable masses or tenderness, no rebound or guarding Extremities: no edema or skin discoloration or tenderness  Pelvic: Vulva: Normal             Vagina: No gross lesions or discharge  Cervix: No gross lesions or discharge.  Pap/HPV HR, Gono-Chlam done.  Uterus  AV, normal size, shape and consistency, non-tender and mobile  Adnexa  Without masses or tenderness  Anus: Normal   Assessment/Plan:  49 y.o. female for annual exam   1. Encounter for routine gynecological examination with Papanicolaou smear of cervix Normal gynecologic exam.  Pap test with high-risk HPV done.  Breast exam normal.  Recent screening mammogram and diagnostic mammogram with ultrasound at [facility].  Scheduled for a repeat diagnostic mammogram and ultrasound in July 2021.  Health labs with family physician.  Will organize colonoscopy.  2. Screen for STD (sexually transmitted disease) .  Condom use recommended. - Gono-Chlam on  Pap - HIV antibody (with reflex) - RPR - Hepatitis C Antibody - Hepatitis B Surface AntiGEN  3. Perimenopause Probably perimenopausal with last menstrual period around August/October 2020.  Vasomotor symptoms present, mainly hot flushes.  Will continue on progesterone 100 mg capsule at bedtime.  Patient informed that contraception is recommended up to 2 years into  menopause.  Will call back to switch to the progestin pill if wishes to do so.  Other orders - progesterone (PROMETRIUM) 100 MG capsule; Take 1 capsule (100 mg total) by mouth at bedtime.  Princess Bruins MD, 8:29 AM 09/30/2019

## 2019-10-01 LAB — RPR: RPR Ser Ql: NONREACTIVE

## 2019-10-01 LAB — HEPATITIS B SURFACE ANTIGEN: Hepatitis B Surface Ag: NONREACTIVE

## 2019-10-01 LAB — HEPATITIS C ANTIBODY
Hepatitis C Ab: NONREACTIVE
SIGNAL TO CUT-OFF: 0.01 (ref <1.00)

## 2019-10-01 LAB — HIV ANTIBODY (ROUTINE TESTING W REFLEX): HIV 1&2 Ab, 4th Generation: NONREACTIVE

## 2019-10-02 LAB — PAP IG, CT-NG NAA, HPV HIGH-RISK
C. trachomatis RNA, TMA: NOT DETECTED
HPV DNA High Risk: NOT DETECTED
N. gonorrhoeae RNA, TMA: NOT DETECTED

## 2019-10-20 ENCOUNTER — Encounter (INDEPENDENT_AMBULATORY_CARE_PROVIDER_SITE_OTHER): Payer: Self-pay | Admitting: Internal Medicine

## 2019-11-19 ENCOUNTER — Other Ambulatory Visit: Payer: Self-pay

## 2019-11-19 ENCOUNTER — Ambulatory Visit (INDEPENDENT_AMBULATORY_CARE_PROVIDER_SITE_OTHER): Payer: BC Managed Care – PPO | Admitting: Internal Medicine

## 2019-11-19 ENCOUNTER — Encounter (INDEPENDENT_AMBULATORY_CARE_PROVIDER_SITE_OTHER): Payer: Self-pay | Admitting: Internal Medicine

## 2019-11-19 VITALS — BP 90/80 | HR 69 | Temp 97.7°F | Ht 67.5 in | Wt 149.2 lb

## 2019-11-19 DIAGNOSIS — R5381 Other malaise: Secondary | ICD-10-CM

## 2019-11-19 DIAGNOSIS — R5383 Other fatigue: Secondary | ICD-10-CM | POA: Diagnosis not present

## 2019-11-19 DIAGNOSIS — N951 Menopausal and female climacteric states: Secondary | ICD-10-CM

## 2019-11-19 DIAGNOSIS — Z13220 Encounter for screening for lipoid disorders: Secondary | ICD-10-CM | POA: Diagnosis not present

## 2019-11-19 DIAGNOSIS — Z5181 Encounter for therapeutic drug level monitoring: Secondary | ICD-10-CM | POA: Diagnosis not present

## 2019-11-19 DIAGNOSIS — E559 Vitamin D deficiency, unspecified: Secondary | ICD-10-CM

## 2019-11-19 DIAGNOSIS — Z0001 Encounter for general adult medical examination with abnormal findings: Secondary | ICD-10-CM

## 2019-11-19 NOTE — Progress Notes (Signed)
Chief Complaint: This 49 year old lady comes in for an annual physical exam. HPI: Overall, she is doing well.  She had not been taking her progesterone and yesterday she got significant bleeding.  Otherwise she has been doing well with this.  She also continues on desiccated NP thyroid and her T3 levels were optimal the last time that we checked in December of last year. She continues with the psychiatric medications and she would like her Depakote level checked today.  Past Medical History:  Diagnosis Date  . Bipolar 1 disorder ([place])   . Hormone disorder   . Malaise and fatigue 05/26/2019  . Perimenopause 05/26/2019   Past Surgical History:  Procedure Laterality Date  . arm surgery     left   . CERVICAL BIOPSY  W/ LOOP ELECTRODE EXCISION     x2  . 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    Social History   Tobacco Use  . Smoking status: Never Smoker  . Smokeless tobacco: Never Used  Substance Use Topics  . Alcohol use: Not Currently    Comment: hx of alchoholism       Allergies:  Allergies  Allergen Reactions  . Apple Swelling  . Charentais Melon (French Melon) Swelling  . Chlorpromazine Other (See Comments)    High fever fever  . Mangifera Indica Swelling     Current Meds  Medication Sig  . Ascorbic Acid (VITAMIN C) 1000 MG tablet Take 1,000 mg by mouth daily.  . cetirizine (ZYRTEC) 10 MG tablet Take 10 mg by mouth daily.  . Cholecalciferol (VITAMIN D3) 125 MCG (5000 UT) TABS Take 1 tablet by mouth daily.  . divalproex (DEPAKOTE) 250 MG DR tablet Take 3 tablets (750 mg total) by mouth daily.  . famotidine (PEPCID) 20 MG tablet Take 1 tablet (20 mg total) by mouth daily.  . montelukast (SINGULAIR) 10 MG tablet Take 1 tablet (10 mg total) by mouth daily.  . pantoprazole  (PROTONIX) 40 MG tablet Take 1 tablet (40 mg total) by mouth daily. Take 30-60 min before first meal of the day  . progesterone (PROMETRIUM) 100 MG capsule Take 1 capsule (100 mg total) by mouth at bedtime.  Marland Kitchen QUEtiapine (SEROQUEL XR) 50 MG TB24 24 hr tablet 1 tablet (50 mg total) once daily  . thyroid (NP THYROID) 90 MG tablet Take 1 tablet (90 mg total) by mouth daily.  . traZODone (DESYREL) 150 MG tablet Take 1 tablet (150 mg total) by mouth at bedtime.      Depression screen PHQ 2/9 11/19/2019  Decreased Interest 0  Down, Depressed, Hopeless 0  PHQ - 2 Score 0     [id number] from the symptoms mentioned above,there are no other symptoms referable to all systems reviewed.       Physical Exam: Blood pressure 90/80, pulse 69, temperature 97.7 F (36.5 C), temperature source Temporal, height 5' 7.5" (1.715 m), weight 149 lb 3.2 oz (67.7 kg), SpO2 98 %. Vitals with BMI 11/19/2019 09/30/2019 05/26/2019  Height 5' 7.5" 5' 7.5" 5\' 9"   Weight 149 lbs 3 oz 144 lbs 6 oz 144 lbs  BMI 23.01 40.81 44.81  Systolic 90 856 314  Diastolic 80 72 66  Pulse 69 - 73      She looks systemically well. General: Alert, cooperative,  and appears to be the stated age.No pallor.  No jaundice.  No clubbing. Head: Normocephalic Eyes: Sclera white, pupils equal and reactive to light, red reflex x 2,  Ears: Normal bilaterally Oral cavity: Lips, mucosa, and tongue normal: Teeth and gums normal Neck: No adenopathy, supple, symmetrical, trachea midline, and thyroid does not appear enlarged. Breast: No masses felt. Respiratory: Clear to auscultation bilaterally.No wheezing, crackles or bronchial breathing. Cardiovascular: Heart sounds are present and appear to be normal without murmurs or added sounds.  No carotid bruits.  Peripheral pulses are present and equal bilaterally.: Gastrointestinal:positive bowel sounds, no hepatosplenomegaly.  No masses felt.No tenderness. Skin: Clear, No rashes noted.No worrisome  skin lesions seen. Neurological: Grossly intact without focal findings, cranial nerves II through XII intact, muscle strength equal bilaterally Musculoskeletal: No acute joint abnormalities noted.Full range of movement noted with joints. Psychiatric: Affect appropriate, non-anxious.    Assessment  1. Encounter for general adult medical examination with abnormal findings   2. Perimenopause   3. Malaise and fatigue   4. Encounter for medication monitoring   5. Vitamin D deficiency disease   6. Screening for lipoid disorders     Tests Ordered:   Orders Placed This Encounter  Procedures  . Valproic Acid Level  . Lipid panel  . CBC  . COMPLETE METABOLIC PANEL WITH GFR  . VITAMIN D 25 Hydroxy (Vit-D Deficiency, Fractures)     Plan  1. Healthy 49 year old lady. 2. Blood work is ordered. 3. Further recommendations will depend on blood results and I will see her in 6 months time for follow-up.     No orders of the defined types were placed in this encounter.    Hanif Radin C Jamariya Davidoff   11/19/2019, 4:40 PM

## 2019-11-20 LAB — VITAMIN D 25 HYDROXY (VIT D DEFICIENCY, FRACTURES): Vit D, 25-Hydroxy: 64 ng/mL (ref 30–100)

## 2019-11-20 LAB — CBC
HCT: 41.1 % (ref 35.0–45.0)
Hemoglobin: 13.6 g/dL (ref 11.7–15.5)
MCH: 28.9 pg (ref 27.0–33.0)
MCHC: 33.1 g/dL (ref 32.0–36.0)
MCV: 87.3 fL (ref 80.0–100.0)
MPV: 9.9 fL (ref 7.5–12.5)
Platelets: 172 10*3/uL (ref 140–400)
RBC: 4.71 10*6/uL (ref 3.80–5.10)
RDW: 11.5 % (ref 11.0–15.0)
WBC: 7.3 10*3/uL (ref 3.8–10.8)

## 2019-11-20 LAB — LIPID PANEL
Cholesterol: 235 mg/dL — ABNORMAL HIGH (ref <200)
HDL: 72 mg/dL (ref >=50)
LDL Cholesterol (Calc): 138 mg/dL (calc) — ABNORMAL HIGH
Non-HDL Cholesterol (Calc): 163 mg/dL (calc) — ABNORMAL HIGH (ref <130)
Total CHOL/HDL Ratio: 3.3 (calc) (ref <5.0)
Triglycerides: 126 mg/dL (ref <150)

## 2019-11-20 LAB — COMPLETE METABOLIC PANEL WITH GFR
AG Ratio: 1.6 (calc) (ref 1.0–2.5)
ALT: 7 U/L (ref 6–29)
AST: 13 U/L (ref 10–35)
Albumin: 4.2 g/dL (ref 3.6–5.1)
Alkaline phosphatase (APISO): 38 U/L (ref 31–125)
BUN: 15 mg/dL (ref 7–25)
CO2: 30 mmol/L (ref 20–32)
Calcium: 9.6 mg/dL (ref 8.6–10.2)
Chloride: 102 mmol/L (ref 98–110)
Creat: 0.99 mg/dL (ref 0.50–1.10)
GFR, Est African American: 78 mL/min/{1.73_m2} (ref >=60)
GFR, Est Non African American: 67 mL/min/{1.73_m2} (ref >=60)
Globulin: 2.6 g/dL (calc) (ref 1.9–3.7)
Glucose, Bld: 85 mg/dL (ref 65–99)
Potassium: 4.5 mmol/L (ref 3.5–5.3)
Sodium: 137 mmol/L (ref 135–146)
Total Bilirubin: 0.4 mg/dL (ref 0.2–1.2)
Total Protein: 6.8 g/dL (ref 6.1–8.1)

## 2019-11-20 LAB — VALPROIC ACID LEVEL: Valproic Acid Lvl: 64.3 mg/L (ref 50.0–100.0)

## 2019-11-26 ENCOUNTER — Encounter (INDEPENDENT_AMBULATORY_CARE_PROVIDER_SITE_OTHER): Payer: Self-pay | Admitting: Internal Medicine

## 2019-11-27 ENCOUNTER — Encounter (INDEPENDENT_AMBULATORY_CARE_PROVIDER_SITE_OTHER): Payer: Self-pay

## 2019-12-06 DIAGNOSIS — F411 Generalized anxiety disorder: Secondary | ICD-10-CM | POA: Diagnosis not present

## 2019-12-06 DIAGNOSIS — F3181 Bipolar II disorder: Secondary | ICD-10-CM | POA: Diagnosis not present

## 2019-12-24 ENCOUNTER — Other Ambulatory Visit (INDEPENDENT_AMBULATORY_CARE_PROVIDER_SITE_OTHER): Payer: Self-pay | Admitting: Internal Medicine

## 2019-12-24 ENCOUNTER — Other Ambulatory Visit: Payer: Self-pay | Admitting: Internal Medicine

## 2019-12-24 DIAGNOSIS — J45991 Cough variant asthma: Secondary | ICD-10-CM

## 2020-02-10 ENCOUNTER — Other Ambulatory Visit: Payer: Self-pay | Admitting: Internal Medicine

## 2020-02-10 DIAGNOSIS — J45991 Cough variant asthma: Secondary | ICD-10-CM

## 2020-02-10 MED ORDER — NP THYROID 90 MG PO TABS: 90.0000 mg | ORAL_TABLET | Freq: Every day | ORAL | 0 refills | Status: DC

## 2020-02-10 MED ORDER — PANTOPRAZOLE SODIUM 40 MG PO TBEC: 40.0000 mg | DELAYED_RELEASE_TABLET | Freq: Every day | ORAL | 0 refills | Status: DC

## 2020-02-10 MED ORDER — TRAZODONE HCL 150 MG PO TABS: 150.0000 mg | ORAL_TABLET | Freq: Every day | ORAL | 0 refills | Status: DC

## 2020-02-10 MED ORDER — MONTELUKAST SODIUM 10 MG PO TABS: 10.0000 mg | ORAL_TABLET | Freq: Every day | ORAL | 0 refills | Status: DC

## 2020-03-04 ENCOUNTER — Other Ambulatory Visit (INDEPENDENT_AMBULATORY_CARE_PROVIDER_SITE_OTHER): Payer: Self-pay

## 2020-03-04 DIAGNOSIS — J45991 Cough variant asthma: Secondary | ICD-10-CM

## 2020-03-04 MED ORDER — MONTELUKAST SODIUM 10 MG PO TABS: 10.0000 mg | ORAL_TABLET | Freq: Every day | ORAL | 0 refills | Status: DC

## 2020-03-04 MED ORDER — PANTOPRAZOLE SODIUM 40 MG PO TBEC: 40.0000 mg | DELAYED_RELEASE_TABLET | Freq: Every day | ORAL | 0 refills | Status: DC

## 2020-03-04 MED ORDER — FAMOTIDINE 20 MG PO TABS: 20.0000 mg | ORAL_TABLET | Freq: Every day | ORAL | 0 refills | Status: DC

## 2020-03-04 MED ORDER — NP THYROID 90 MG PO TABS: 90.0000 mg | ORAL_TABLET | Freq: Every day | ORAL | 0 refills | Status: DC

## 2020-03-04 MED ORDER — DIVALPROEX SODIUM 250 MG PO DR TAB: 750.0000 mg | DELAYED_RELEASE_TABLET | Freq: Every day | ORAL | 0 refills | Status: DC

## 2020-03-04 MED ORDER — TRAZODONE HCL 150 MG PO TABS: 150.0000 mg | ORAL_TABLET | Freq: Every day | ORAL | 0 refills | Status: DC

## 2020-03-16 ENCOUNTER — Telehealth (INDEPENDENT_AMBULATORY_CARE_PROVIDER_SITE_OTHER): Payer: Self-pay

## 2020-03-16 DIAGNOSIS — J45991 Cough variant asthma: Secondary | ICD-10-CM

## 2020-03-16 MED ORDER — TRAZODONE HCL 150 MG PO TABS: 150.0000 mg | ORAL_TABLET | Freq: Every day | ORAL | 0 refills | Status: DC

## 2020-03-16 MED ORDER — VITAMIN D3 125 MCG (5000 UT) PO TABS: 1.0000 | ORAL_TABLET | Freq: Every day | ORAL | 3 refills | Status: AC

## 2020-03-16 MED ORDER — PANTOPRAZOLE SODIUM 40 MG PO TBEC: 40.0000 mg | DELAYED_RELEASE_TABLET | Freq: Every day | ORAL | 0 refills | Status: DC

## 2020-03-16 MED ORDER — VITAMIN C 1000 MG PO TABS: 1000.0000 mg | ORAL_TABLET | Freq: Every day | ORAL | 3 refills | Status: AC

## 2020-03-16 MED ORDER — FAMOTIDINE 20 MG PO TABS: 20.0000 mg | ORAL_TABLET | Freq: Every day | ORAL | 0 refills | Status: DC

## 2020-03-16 MED ORDER — PROGESTERONE MICRONIZED 100 MG PO CAPS: 100.0000 mg | ORAL_CAPSULE | Freq: Every day | ORAL | 4 refills | Status: DC

## 2020-03-16 MED ORDER — QUETIAPINE FUMARATE ER 50 MG PO TB24: 50.0000 mg | ORAL_TABLET | Freq: Every day | ORAL | 0 refills | Status: AC

## 2020-03-16 MED ORDER — NP THYROID 90 MG PO TABS: 90.0000 mg | ORAL_TABLET | Freq: Every day | ORAL | 0 refills | Status: DC

## 2020-03-16 MED ORDER — CETIRIZINE HCL 10 MG PO TABS: 10.0000 mg | ORAL_TABLET | Freq: Every day | ORAL | 0 refills | Status: AC

## 2020-03-16 MED ORDER — DIVALPROEX SODIUM 250 MG PO DR TAB: 750.0000 mg | DELAYED_RELEASE_TABLET | Freq: Every day | ORAL | 0 refills | Status: AC

## 2020-03-16 MED ORDER — MONTELUKAST SODIUM 10 MG PO TABS: 10.0000 mg | ORAL_TABLET | Freq: Every day | ORAL | 0 refills | Status: DC

## 2020-03-16 NOTE — Telephone Encounter (Signed)
Refills resent to correct pharmacy

## 2020-03-26 DIAGNOSIS — F411 Generalized anxiety disorder: Secondary | ICD-10-CM | POA: Diagnosis not present

## 2020-03-26 DIAGNOSIS — F3181 Bipolar II disorder: Secondary | ICD-10-CM | POA: Diagnosis not present

## 2020-05-18 ENCOUNTER — Other Ambulatory Visit (INDEPENDENT_AMBULATORY_CARE_PROVIDER_SITE_OTHER): Payer: Self-pay | Admitting: Internal Medicine

## 2020-05-18 DIAGNOSIS — Z1231 Encounter for screening mammogram for malignant neoplasm of breast: Secondary | ICD-10-CM

## 2020-05-20 ENCOUNTER — Ambulatory Visit (INDEPENDENT_AMBULATORY_CARE_PROVIDER_SITE_OTHER): Payer: BC Managed Care – PPO | Admitting: Internal Medicine

## 2020-05-31 DIAGNOSIS — J029 Acute pharyngitis, unspecified: Secondary | ICD-10-CM | POA: Diagnosis not present

## 2020-05-31 DIAGNOSIS — Z20822 Contact with and (suspected) exposure to covid-19: Secondary | ICD-10-CM | POA: Diagnosis not present

## 2020-06-09 ENCOUNTER — Encounter (INDEPENDENT_AMBULATORY_CARE_PROVIDER_SITE_OTHER): Payer: Self-pay | Admitting: Internal Medicine

## 2020-06-09 ENCOUNTER — Ambulatory Visit (INDEPENDENT_AMBULATORY_CARE_PROVIDER_SITE_OTHER): Payer: BC Managed Care – PPO | Admitting: Internal Medicine

## 2020-06-09 ENCOUNTER — Other Ambulatory Visit: Payer: Self-pay

## 2020-06-09 VITALS — BP 98/64 | HR 83 | Temp 97.7°F | Ht 67.5 in | Wt 149.2 lb

## 2020-06-09 DIAGNOSIS — J45991 Cough variant asthma: Secondary | ICD-10-CM | POA: Diagnosis not present

## 2020-06-09 DIAGNOSIS — Z5181 Encounter for therapeutic drug level monitoring: Secondary | ICD-10-CM

## 2020-06-09 DIAGNOSIS — E559 Vitamin D deficiency, unspecified: Secondary | ICD-10-CM

## 2020-06-09 DIAGNOSIS — E785 Hyperlipidemia, unspecified: Secondary | ICD-10-CM

## 2020-06-09 MED ORDER — PROGESTERONE MICRONIZED 100 MG PO CAPS
200.0000 mg | ORAL_CAPSULE | Freq: Every day | ORAL | 1 refills | Status: DC
Start: 2020-06-09 — End: 2020-06-28

## 2020-06-09 MED ORDER — MONTELUKAST SODIUM 10 MG PO TABS: 10.0000 mg | ORAL_TABLET | Freq: Every day | ORAL | 0 refills | Status: DC

## 2020-06-09 MED ORDER — FAMOTIDINE 20 MG PO TABS: 20.0000 mg | ORAL_TABLET | Freq: Every day | ORAL | 0 refills | Status: DC

## 2020-06-09 MED ORDER — PANTOPRAZOLE SODIUM 40 MG PO TBEC: 40.0000 mg | DELAYED_RELEASE_TABLET | Freq: Every day | ORAL | 0 refills | Status: AC

## 2020-06-09 NOTE — Progress Notes (Signed)
Metrics: Intervention Frequency ACO  Documented Smoking Status Yearly  Screened one or more times in 24 months  Cessation Counseling or  Active cessation medication Past 24 months  Past 24 months   Guideline developer: UpToDate (See UpToDate for funding source) Date Released: 2014       Wellness Office Visit  Subjective:  Patient ID: Paige Bowen, female    DOB: [birth year]  Age: 49 y.o. MRN: [id number]  CC: This lady comes in for follow-up of vitamin D deficiency, dyslipidemia, asthma. HPI  Overall, she is doing very well.  She continues on desiccated NP thyroid, off label, for symptoms of thyroid hypofunction.  On the last visit, her total cholesterol was elevated.  She continues to work on nutrition and exercise. She is in the perimenopause and her cycles are more irregular.  Progesterone 100 mg every night does seem to help her. She is mostly enjoying her work.  Past Medical History:  Diagnosis Date  . Bipolar 1 disorder ([facility])   . Hormone disorder   . Malaise and fatigue 05/26/2019  . Perimenopause 05/26/2019   Past Surgical History:  Procedure Laterality Date  . arm surgery     left   . CERVICAL BIOPSY  W/ LOOP ELECTRODE EXCISION     x2  . DILATION AND CURETTAGE OF UTERUS       Family History  Problem Relation Age of Onset  . Heart disease Mother   . Hypertension Father   . Breast cancer Maternal Aunt   . 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   Social History   Tobacco Use  . Smoking status: Never Smoker  . Smokeless tobacco: Never Used  Substance Use Topics  . Alcohol use: Not Currently    Comment: hx of alchoholism     Current Meds  Medication Sig  . Ascorbic Acid (VITAMIN C) 1000 MG tablet Take 1 tablet (1,000 mg total) by mouth daily.  . cetirizine (ZYRTEC) 10 MG  tablet Take 1 tablet (10 mg total) by mouth daily.  . Cholecalciferol (VITAMIN D3) 125 MCG (5000 UT) TABS Take 1 tablet (5,000 Units total) by mouth daily.  . divalproex (DEPAKOTE) 250 MG DR tablet Take 3 tablets (750 mg total) by mouth daily.  . Melatonin 1 MG CAPS Take 1-2 capsules by mouth at bedtime.  . NP THYROID 90 MG tablet Take 1 tablet (90 mg total) by mouth daily.  . QUEtiapine (SEROQUEL XR) 50 MG TB24 24 hr tablet Take 1 tablet (50 mg total) by mouth daily.  . [DISCONTINUED] famotidine (PEPCID) 20 MG tablet Take 1 tablet (20 mg total) by mouth daily.  . [DISCONTINUED] montelukast (SINGULAIR) 10 MG tablet Take 1 tablet (10 mg total) by mouth daily.  . [DISCONTINUED] pantoprazole (PROTONIX) 40 MG tablet Take 1 tablet (40 mg total) by mouth daily. Take 30-60 min before first meal of the day  . [DISCONTINUED] progesterone (PROMETRIUM) 100 MG capsule Take 1 capsule (100 mg total) by mouth at bedtime.      Depression screen [facility] 2/9 06/09/2020 11/19/2019  Decreased Interest 0 0  Down, Depressed, Hopeless 0 0  PHQ - 2 Score 0 0  Altered sleeping 1 -  Tired, decreased energy 1 -  Change in appetite 0 -  Feeling bad or failure about yourself  0 -  Trouble concentrating  0 -  Moving slowly or fidgety/restless 0 -  Suicidal thoughts 0 -  PHQ-9 Score 2 -  Difficult doing work/chores Not difficult at all -     Objective:   Today's Vitals: BP 98/64   Pulse 83   Temp 97.7 F (36.5 C) (Temporal)   Ht 5' 7.5" (1.715 m)   Wt 149 lb 3.2 oz (67.7 kg)   SpO2 98%   BMI 23.02 kg/m  Vitals with BMI 06/09/2020 11/19/2019 09/30/2019  Height 5' 7.5" 5' 7.5" 5' 7.5"  Weight 149 lbs 3 oz 149 lbs 3 oz 144 lbs 6 oz  BMI 23.01 23.01 22.27  Systolic 98 90 108  Diastolic 64 80 72  Pulse 83 69 -     Physical Exam   She looks systemically well.  Weight is very stable.  Blood pressure is excellent.    Assessment   1. Vitamin D deficiency disease   2. Cough variant asthma   3. Dyslipidemia    4. Encounter for medication monitoring       Tests ordered Orders Placed This Encounter  Procedures  . Lipid panel  . Valproic Acid Level     Plan: 1. She will continue with vitamin D3 for vitamin D deficiency. 2. She will continue with Singulair, famotidine and Protonix for history of asthma which may have a reflux component to it.  I have refilled these medications. 3. I will check a lipid panel today as well as valproic acid level since she is on Depakote. 4. Further recommendations will depend on blood results and I will follow up with her virtually in 6 months time.   Meds ordered this encounter  Medications  . famotidine (PEPCID) 20 MG tablet    Sig: Take 1 tablet (20 mg total) by mouth daily.    Dispense:  90 tablet    Refill:  0    Would need appt for any refills  . montelukast (SINGULAIR) 10 MG tablet    Sig: Take 1 tablet (10 mg total) by mouth daily.    Dispense:  90 tablet    Refill:  0    Needs appt for refills  . pantoprazole (PROTONIX) 40 MG tablet    Sig: Take 1 tablet (40 mg total) by mouth daily. Take 30-60 min before first meal of the day    Dispense:  90 tablet    Refill:  0  . progesterone (PROMETRIUM) 100 MG capsule    Sig: Take 2 capsules (200 mg total) by mouth at bedtime.    Dispense:  180 capsule    Refill:  1    Durant Scibilia Normajean Glasgow, MD

## 2020-06-10 LAB — LIPID PANEL
Cholesterol: 202 mg/dL — ABNORMAL HIGH (ref <200)
HDL: 65 mg/dL (ref >=50)
LDL Cholesterol (Calc): 116 mg/dL (calc) — ABNORMAL HIGH
Non-HDL Cholesterol (Calc): 137 mg/dL (calc) — ABNORMAL HIGH (ref <130)
Total CHOL/HDL Ratio: 3.1 (calc) (ref <5.0)
Triglycerides: 103 mg/dL (ref <150)

## 2020-06-10 LAB — VALPROIC ACID LEVEL: Valproic Acid Lvl: 84.5 mg/L (ref 50.0–100.0)

## 2020-06-28 ENCOUNTER — Telehealth (INDEPENDENT_AMBULATORY_CARE_PROVIDER_SITE_OTHER): Payer: Self-pay

## 2020-06-28 ENCOUNTER — Other Ambulatory Visit (INDEPENDENT_AMBULATORY_CARE_PROVIDER_SITE_OTHER): Payer: Self-pay | Admitting: Internal Medicine

## 2020-06-28 MED ORDER — PROGESTERONE MICRONIZED 100 MG PO CAPS: 200.0000 mg | ORAL_CAPSULE | Freq: Every day | ORAL | 1 refills | Status: DC

## 2020-06-28 NOTE — Telephone Encounter (Signed)
Patient called and left a detailed voice message that she had been taking 1 Progesterone 200 mg at night and had increased to taking 2 of the Progesterone 200 mg at night for a total of 400 mg per night.  Patient stated that she received this refill of the Progesterone 100 mg  progesterone (PROMETRIUM) 100 MG capsule  Last filled 06/09/2020,  # 180 with 1 refill  Patient stated that she thought she was advised to increase to take 400 mg each night?  Please advise what the correct dose for patient is of her Progesterone.

## 2020-06-28 NOTE — Telephone Encounter (Signed)
According to my notes, she should be taking a dose of progesterone 200 mg at night which is the correct refill that I have already sent but I have sent it again.

## 2020-06-28 NOTE — Telephone Encounter (Signed)
Called patient and gave her the message from Dr. Karilyn Cota and she stated that she will go back on the 200mg  of the Progesterone daily. Patient verbalized an understanding.

## 2020-07-22 ENCOUNTER — Encounter (INDEPENDENT_AMBULATORY_CARE_PROVIDER_SITE_OTHER): Payer: Self-pay

## 2020-07-22 ENCOUNTER — Other Ambulatory Visit: Payer: Self-pay

## 2020-07-22 ENCOUNTER — Encounter (INDEPENDENT_AMBULATORY_CARE_PROVIDER_SITE_OTHER): Payer: Self-pay | Admitting: Internal Medicine

## 2020-07-22 ENCOUNTER — Telehealth (INDEPENDENT_AMBULATORY_CARE_PROVIDER_SITE_OTHER): Payer: BC Managed Care – PPO | Admitting: Internal Medicine

## 2020-07-22 VITALS — Temp 101.7°F | Resp 19 | Ht 67.0 in | Wt 150.0 lb

## 2020-07-22 DIAGNOSIS — J45991 Cough variant asthma: Secondary | ICD-10-CM | POA: Diagnosis not present

## 2020-07-22 DIAGNOSIS — R1084 Generalized abdominal pain: Secondary | ICD-10-CM | POA: Diagnosis not present

## 2020-07-22 DIAGNOSIS — J3089 Other allergic rhinitis: Secondary | ICD-10-CM | POA: Diagnosis not present

## 2020-07-22 DIAGNOSIS — R509 Fever, unspecified: Secondary | ICD-10-CM

## 2020-07-22 DIAGNOSIS — R109 Unspecified abdominal pain: Secondary | ICD-10-CM | POA: Diagnosis not present

## 2020-07-22 DIAGNOSIS — F3181 Bipolar II disorder: Secondary | ICD-10-CM | POA: Diagnosis not present

## 2020-07-22 MED ORDER — MONTELUKAST SODIUM 10 MG PO TABS: 10.0000 mg | ORAL_TABLET | Freq: Every day | ORAL | 0 refills | Status: AC

## 2020-07-22 MED ORDER — FAMOTIDINE 20 MG PO TABS
20.0000 mg | ORAL_TABLET | Freq: Every day | ORAL | 0 refills | Status: AC
Start: 2020-07-22 — End: ?

## 2020-07-22 MED ORDER — ONDANSETRON HCL 4 MG PO TABS: 4.0000 mg | ORAL_TABLET | Freq: Three times a day (TID) | ORAL | 0 refills | Status: AC | PRN

## 2020-07-22 NOTE — Progress Notes (Signed)
Metrics: Intervention Frequency ACO  Documented Smoking Status Yearly  Screened one or more times in 24 months  Cessation Counseling or  Active cessation medication Past 24 months  Past 24 months   Guideline developer: UpToDate (See UpToDate for funding source) Date Released: 2014       Wellness Office Visit  Subjective:  Patient ID: Paige Bowen, female    DOB: 11-01-1970  Age: 50 y.o. MRN: [id number]  CC: This is an audio telemedicine visit with the permission of the patient who is at home and I am in my office.  I used 2 identifiers to identify the patient. Fever, abdominal cramping. HPI  The patient started having symptoms yesterday.  The prior day to yesterday, she had given blood. She denies any nausea or diarrhea.  She did do a home Covid 19 test and it was negative. She says she is going to an urgent care again today. Past Medical History:  Diagnosis Date  . Bipolar 1 disorder (HCC)   . Hormone disorder   . Malaise and fatigue 05/26/2019  . Perimenopause 05/26/2019   Past Surgical History:  Procedure Laterality Date  . arm surgery     left   . CERVICAL BIOPSY  W/ LOOP ELECTRODE EXCISION     x2  . DILATION AND CURETTAGE OF UTERUS       Family History  Problem Relation Age of Onset  . Heart disease Mother   . Hypertension Father   . Breast cancer Maternal Aunt   . 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   Social History   Tobacco Use  . Smoking status: Never Smoker  . Smokeless tobacco: Never Used  Substance Use Topics  . Alcohol use: Not Currently    Comment: hx of alchoholism     Current Meds  Medication Sig  . Ascorbic Acid (VITAMIN C) 1000 MG tablet Take 1 tablet (1,000 mg total) by mouth daily.  . cetirizine (ZYRTEC) 10 MG tablet Take 1 tablet (10 mg total) by  mouth daily.  . Cholecalciferol (VITAMIN D3) 125 MCG (5000 UT) TABS Take 1 tablet (5,000 Units total) by mouth daily.  . divalproex (DEPAKOTE) 250 MG DR tablet Take 3 tablets (750 mg total) by mouth daily.  . Melatonin 1 MG CAPS Take 1-2 capsules by mouth at bedtime.  . NP THYROID 90 MG tablet Take 1 tablet (90 mg total) by mouth daily.  . ondansetron (ZOFRAN) 4 MG tablet Take 1 tablet (4 mg total) by mouth every 8 (eight) hours as needed for nausea or vomiting.  . pantoprazole (PROTONIX) 40 MG tablet Take 1 tablet (40 mg total) by mouth daily. Take 30-60 min before first meal of the day  . progesterone (PROMETRIUM) 100 MG capsule Take 2 capsules (200 mg total) by mouth at bedtime.  Marland Kitchen QUEtiapine (SEROQUEL XR) 50 MG TB24 24 hr tablet Take 1 tablet (50 mg total) by mouth daily.  . [DISCONTINUED] famotidine (PEPCID) 20 MG tablet Take 1 tablet (20 mg total) by mouth daily.  . [DISCONTINUED] montelukast (SINGULAIR) 10 MG tablet Take 1 tablet (10 mg total) by mouth daily.      Depression screen [facility] 2/9 07/22/2020 06/09/2020 11/19/2019  Decreased Interest 0 0 0  Down, Depressed, Hopeless 0 0 0  PHQ - 2 Score 0 0 0  Altered sleeping -  1 -  Tired, decreased energy - 1 -  Change in appetite - 0 -  Feeling bad or failure about yourself  - 0 -  Trouble concentrating - 0 -  Moving slowly or fidgety/restless - 0 -  Suicidal thoughts - 0 -  PHQ-9 Score - 2 -  Difficult doing work/chores - Not difficult at all -     Objective:   Today's Vitals: Temp (!) 101.7 F (38.7 C) (Oral)   Resp 19   Ht 5\' 7"  (1.702 m)   Wt 150 lb (68 kg) Comment: pt est guess.  BMI 23.49 kg/m  Vitals with BMI 07/22/2020 06/09/2020 11/19/2019  Height 5\' 7"  5' 7.5" 5' 7.5"  Weight 150 lbs 149 lbs 3 oz 149 lbs 3 oz  BMI 23.49 23.01 23.01  Systolic (No Data) 98 90  Diastolic (No Data) 64 80  Pulse - 83 69     Physical Exam  Virtual visit.  She appears to be alert and oriented on the phone.     Assessment   1.  Fever, unspecified fever cause   2. Abdominal cramping   3. Cough variant asthma       Tests ordered No orders of the defined types were placed in this encounter.    Plan: 1. I have sent a prescription for Zofran in case she starts to get nausea and vomiting with her symptoms which look like they are viral in origin.  I told her that this could still be COVID-19 disease and I am glad that she is going to the urgent care because I think she should probably have a repeat COVID-19 test.  She is trying to encourage fluids to keep hydrated and I agree with this plan. 2. This phone call lasted 5 minutes and 3 seconds.   Meds ordered this encounter  Medications  . ondansetron (ZOFRAN) 4 MG tablet    Sig: Take 1 tablet (4 mg total) by mouth every 8 (eight) hours as needed for nausea or vomiting.    Dispense:  30 tablet    Refill:  0  . famotidine (PEPCID) 20 MG tablet    Sig: Take 1 tablet (20 mg total) by mouth daily.    Dispense:  90 tablet    Refill:  0  . montelukast (SINGULAIR) 10 MG tablet    Sig: Take 1 tablet (10 mg total) by mouth daily.    Dispense:  90 tablet    Refill:  0    Krishon Adkison 01/19/2020, MD

## 2020-07-25 DIAGNOSIS — N12 Tubulo-interstitial nephritis, not specified as acute or chronic: Secondary | ICD-10-CM | POA: Diagnosis not present

## 2020-07-25 DIAGNOSIS — D72829 Elevated white blood cell count, unspecified: Secondary | ICD-10-CM | POA: Diagnosis not present

## 2020-07-25 DIAGNOSIS — J45991 Cough variant asthma: Secondary | ICD-10-CM | POA: Diagnosis not present

## 2020-07-25 DIAGNOSIS — R059 Cough, unspecified: Secondary | ICD-10-CM | POA: Diagnosis not present

## 2020-07-25 DIAGNOSIS — N3289 Other specified disorders of bladder: Secondary | ICD-10-CM | POA: Diagnosis not present

## 2020-07-25 DIAGNOSIS — Z20822 Contact with and (suspected) exposure to covid-19: Secondary | ICD-10-CM | POA: Diagnosis not present

## 2020-07-25 DIAGNOSIS — R918 Other nonspecific abnormal finding of lung field: Secondary | ICD-10-CM | POA: Diagnosis not present

## 2020-07-25 DIAGNOSIS — N390 Urinary tract infection, site not specified: Secondary | ICD-10-CM | POA: Diagnosis not present

## 2020-07-25 DIAGNOSIS — D509 Iron deficiency anemia, unspecified: Secondary | ICD-10-CM | POA: Diagnosis not present

## 2020-07-25 DIAGNOSIS — R509 Fever, unspecified: Secondary | ICD-10-CM | POA: Diagnosis not present

## 2020-07-25 DIAGNOSIS — Z79899 Other long term (current) drug therapy: Secondary | ICD-10-CM | POA: Diagnosis not present

## 2020-07-25 DIAGNOSIS — N179 Acute kidney failure, unspecified: Secondary | ICD-10-CM | POA: Diagnosis not present

## 2020-07-25 DIAGNOSIS — R748 Abnormal levels of other serum enzymes: Secondary | ICD-10-CM | POA: Diagnosis not present

## 2020-07-25 DIAGNOSIS — E876 Hypokalemia: Secondary | ICD-10-CM | POA: Diagnosis not present

## 2020-07-25 DIAGNOSIS — R5381 Other malaise: Secondary | ICD-10-CM | POA: Diagnosis not present

## 2020-07-25 DIAGNOSIS — I959 Hypotension, unspecified: Secondary | ICD-10-CM | POA: Diagnosis not present

## 2020-07-25 DIAGNOSIS — F319 Bipolar disorder, unspecified: Secondary | ICD-10-CM | POA: Diagnosis not present

## 2020-07-25 DIAGNOSIS — R519 Headache, unspecified: Secondary | ICD-10-CM | POA: Diagnosis not present

## 2020-07-25 DIAGNOSIS — Z9109 Other allergy status, other than to drugs and biological substances: Secondary | ICD-10-CM | POA: Diagnosis not present

## 2020-07-28 ENCOUNTER — Telehealth (INDEPENDENT_AMBULATORY_CARE_PROVIDER_SITE_OTHER): Payer: Self-pay | Admitting: Internal Medicine

## 2020-07-28 NOTE — Telephone Encounter (Signed)
Okay thanks, I think that with her diagnosis of pyelonephritis, it would be better to see her in person on the 23rd so we can take a urine sample and make sure the urine infection has cleared completely and that she is feeling better.  You can cancel the telemedicine visit on Monday.  Thanks.

## 2020-07-29 ENCOUNTER — Telehealth: Payer: Self-pay

## 2020-07-29 NOTE — Telephone Encounter (Signed)
Transition Care Management Follow-up Telephone Call  Date of discharge and from where: 07/28/2020 from [facility]  How have you been since you were released from the hospital? Pt states that she is feeling much better and slept well last night.   Any questions or concerns? No  Items Reviewed:  Did the pt receive and understand the discharge instructions provided? Yes   Medications obtained and verified? No   Other? No   Any new allergies since your discharge? No   Dietary orders reviewed? N/A  Do you have support at home? Yes   Functional Questionnaire: (I = Independent and D = Dependent) ADLs: I Bathing/Dressing- I Meal Prep- I Eating- I Maintaining continence- I Transferring/Ambulation- I Managing Meds- I  Follow up appointments reviewed:   PCP Hospital f/u appt confirmed? Yes  Scheduled to see Lilly Cove, MD on 08/11/2020 @ 8:00am.  Specialist [facility] f/u appt confirmed? No    Are transportation arrangements needed? No  If their condition worsens, is the pt aware to call PCP or go to the Emergency Dept.? Yes Was the patient provided with contact information for the PCP's office or ED? Yes Was to pt encouraged to call back with questions or concerns? Yes

## 2020-08-02 ENCOUNTER — Telehealth (INDEPENDENT_AMBULATORY_CARE_PROVIDER_SITE_OTHER): Payer: BC Managed Care – PPO | Admitting: Internal Medicine

## 2020-08-11 ENCOUNTER — Encounter (INDEPENDENT_AMBULATORY_CARE_PROVIDER_SITE_OTHER): Payer: Self-pay | Admitting: Internal Medicine

## 2020-08-11 ENCOUNTER — Ambulatory Visit (INDEPENDENT_AMBULATORY_CARE_PROVIDER_SITE_OTHER): Payer: BC Managed Care – PPO | Admitting: Internal Medicine

## 2020-08-11 ENCOUNTER — Other Ambulatory Visit: Payer: Self-pay

## 2020-08-11 VITALS — BP 121/70 | HR 74 | Temp 98.4°F | Resp 18 | Ht 67.0 in | Wt 155.6 lb

## 2020-08-11 DIAGNOSIS — R5381 Other malaise: Secondary | ICD-10-CM | POA: Diagnosis not present

## 2020-08-11 DIAGNOSIS — N390 Urinary tract infection, site not specified: Secondary | ICD-10-CM

## 2020-08-11 DIAGNOSIS — R748 Abnormal levels of other serum enzymes: Secondary | ICD-10-CM

## 2020-08-11 DIAGNOSIS — R5383 Other fatigue: Secondary | ICD-10-CM

## 2020-08-11 MED ORDER — PROGESTERONE 200 MG PO CAPS: 200.0000 mg | ORAL_CAPSULE | Freq: Every evening | ORAL | 1 refills | Status: DC

## 2020-08-11 NOTE — Progress Notes (Signed)
Metrics: Intervention Frequency ACO  Documented Smoking Status Yearly  Screened one or more times in 24 months  Cessation Counseling or  Active cessation medication Past 24 months  Past 24 months   Guideline developer: UpToDate (See UpToDate for funding source) Date Released: 2014       Wellness Office Visit  Subjective:  Patient ID: Paige Bowen, female    DOB: 1970/10/09  Age: 50 y.o. MRN: [id number]  CC: This lady comes in for follow-up of perimenopause, thyroid hypofunction. HPI She recently was hospitalized with what appears to be a urinary tract infection.  I seen her on a telemedicine visit with symptoms and had prescribed Zofran not knowing what the eventual diagnosis was and I encouraged her to make sure she goes to the emergency room if symptoms did not improve.  She had to have IV fluids and I have looked at blood work which showed that she was anemic with a hemoglobin of 10.  She had donated blood prior to this event.  Also renal function was slightly abnormal.  Urine culture actually did not show UTI.  However urinalysis was abnormal and she did improve with intravenous fluids and antibiotics.  Past Medical History:  Diagnosis Date  . Bipolar 1 disorder (HCC)   . Hormone disorder   . Malaise and fatigue 05/26/2019  . Perimenopause 05/26/2019   Past Surgical History:  Procedure Laterality Date  . arm surgery     left   . CERVICAL BIOPSY  W/ LOOP ELECTRODE EXCISION     x2  . DILATION AND CURETTAGE OF UTERUS       Family History  Problem Relation Age of Onset  . Heart disease Mother   . Hypertension Father   . Breast cancer Maternal Aunt   . 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   Social History   Tobacco Use  . Smoking status: Never Smoker  . Smokeless tobacco: Never Used   Substance Use Topics  . Alcohol use: Not Currently    Comment: hx of alchoholism     Current Meds  Medication Sig  . Ascorbic Acid (VITAMIN C) 1000 MG tablet Take 1 tablet (1,000 mg total) by mouth daily.  . cetirizine (ZYRTEC) 10 MG tablet Take 1 tablet (10 mg total) by mouth daily.  . Cholecalciferol (VITAMIN D3) 125 MCG (5000 UT) TABS Take 1 tablet (5,000 Units total) by mouth daily.  . divalproex (DEPAKOTE) 250 MG DR tablet Take 3 tablets (750 mg total) by mouth daily.  . famotidine (PEPCID) 20 MG tablet Take 1 tablet (20 mg total) by mouth daily.  . Melatonin 1 MG CAPS Take 1-2 capsules by mouth at bedtime.  . montelukast (SINGULAIR) 10 MG tablet Take 1 tablet (10 mg total) by mouth daily.  . NP THYROID 90 MG tablet Take 1 tablet (90 mg total) by mouth daily.  . ondansetron (ZOFRAN) 4 MG tablet Take 1 tablet (4 mg total) by mouth every 8 (eight) hours as needed for nausea or vomiting.  . pantoprazole (PROTONIX) 40 MG tablet Take 1 tablet (40 mg total) by mouth daily. Take 30-60 min before first meal of the day  . progesterone (PROMETRIUM) 200 MG capsule Take 1 capsule (200 mg total) by mouth at bedtime.  Marland Kitchen QUEtiapine (SEROQUEL XR) 50 MG TB24 24 hr tablet Take 1  tablet (50 mg total) by mouth daily.  . [DISCONTINUED] progesterone (PROMETRIUM) 100 MG capsule Take 2 capsules (200 mg total) by mouth at bedtime.     Flowsheet Row Office Visit from 06/09/2020 in [facility]  PHQ-9 Total Score 2      Objective:   Today's Vitals: BP 121/70 (BP Location: Right Arm, Patient Position: Sitting, Cuff Size: Normal)   Pulse 74   Temp 98.4 F (36.9 C)   Resp 18   Ht 5\' 7"  (1.702 m)   Wt 155 lb 9.6 oz (70.6 kg)   SpO2 96%   BMI 24.37 kg/m  Vitals with BMI 08/11/2020 07/22/2020 06/09/2020  Height 5\' 7"  5\' 7"  5' 7.5"  Weight 155 lbs 10 oz 150 lbs 149 lbs 3 oz  BMI 24.36 23.49 23.01  Systolic 121 (No Data) 98  Diastolic 70 (No Data) 64  Pulse 74 - 83     Physical  Exam   She looks systemically well.  She has gained a few pounds.  Blood pressure is excellent.    Assessment   1. Malaise and fatigue   2. Elevated liver enzymes   3. Urinary tract infection without hematuria, site unspecified       Tests ordered Orders Placed This Encounter  Procedures  . COMPLETE METABOLIC PANEL WITH GFR  . Urinalysis w microscopic + reflex cultur     Plan: 1. She will continue with the same dose of progesterone and NP thyroid. 2. We will check renal function as well as liver enzymes which is slightly abnormal and we will also check urinalysis and culture. 3. Follow-up in 6 months.  I spent 30 minutes with this patient discussing her hospitalization and answered all questions regarding the blood work and urine results.   Meds ordered this encounter  Medications  . progesterone (PROMETRIUM) 200 MG capsule    Sig: Take 1 capsule (200 mg total) by mouth at bedtime.    Dispense:  90 capsule    Refill:  1    Veola Cafaro 06/11/2020, MD

## 2020-08-13 ENCOUNTER — Encounter (INDEPENDENT_AMBULATORY_CARE_PROVIDER_SITE_OTHER): Payer: Self-pay | Admitting: Internal Medicine

## 2020-08-13 LAB — URINE CULTURE: Result:: NO GROWTH

## 2020-08-13 LAB — COMPLETE METABOLIC PANEL WITH GFR
AG Ratio: 1.2 (calc) (ref 1.0–2.5)
ALT: 14 U/L (ref 6–29)
AST: 16 U/L (ref 10–35)
Albumin: 3.9 g/dL (ref 3.6–5.1)
Alkaline phosphatase (APISO): 61 U/L (ref 31–125)
BUN: 14 mg/dL (ref 7–25)
CO2: 30 mmol/L (ref 20–32)
Calcium: 9.7 mg/dL (ref 8.6–10.2)
Chloride: 104 mmol/L (ref 98–110)
Creat: 1.02 mg/dL (ref 0.50–1.10)
GFR, Est African American: 75 mL/min/{1.73_m2} (ref >=60)
GFR, Est Non African American: 65 mL/min/{1.73_m2} (ref >=60)
Globulin: 3.2 g/dL (calc) (ref 1.9–3.7)
Glucose, Bld: 74 mg/dL (ref 65–139)
Potassium: 4.2 mmol/L (ref 3.5–5.3)
Sodium: 140 mmol/L (ref 135–146)
Total Bilirubin: 0.3 mg/dL (ref 0.2–1.2)
Total Protein: 7.1 g/dL (ref 6.1–8.1)

## 2020-08-13 LAB — CULTURE INDICATED

## 2020-08-13 LAB — URINALYSIS W MICROSCOPIC + REFLEX CULTURE
Bacteria, UA: NONE SEEN /HPF
Bilirubin Urine: NEGATIVE
Glucose, UA: NEGATIVE
Hgb urine dipstick: NEGATIVE
Hyaline Cast: NONE SEEN /LPF
Ketones, ur: NEGATIVE
Nitrites, Initial: NEGATIVE
Protein, ur: NEGATIVE
RBC / HPF: NONE SEEN /HPF (ref 0–2)
Specific Gravity, Urine: 1.008 (ref 1.001–1.03)
Squamous Epithelial / HPF: NONE SEEN /HPF (ref <=5)
WBC, UA: NONE SEEN /HPF (ref 0–5)
pH: 7 (ref 5.0–8.0)

## 2020-09-24 IMAGING — CR DG CHEST 2V
2 series · 2 of 2 positions shown · non-contrast
Comparison: None.

CLINICAL DATA: Cough and shortness of breath

EXAM:
CHEST - 2 VIEW

[w chest pa]
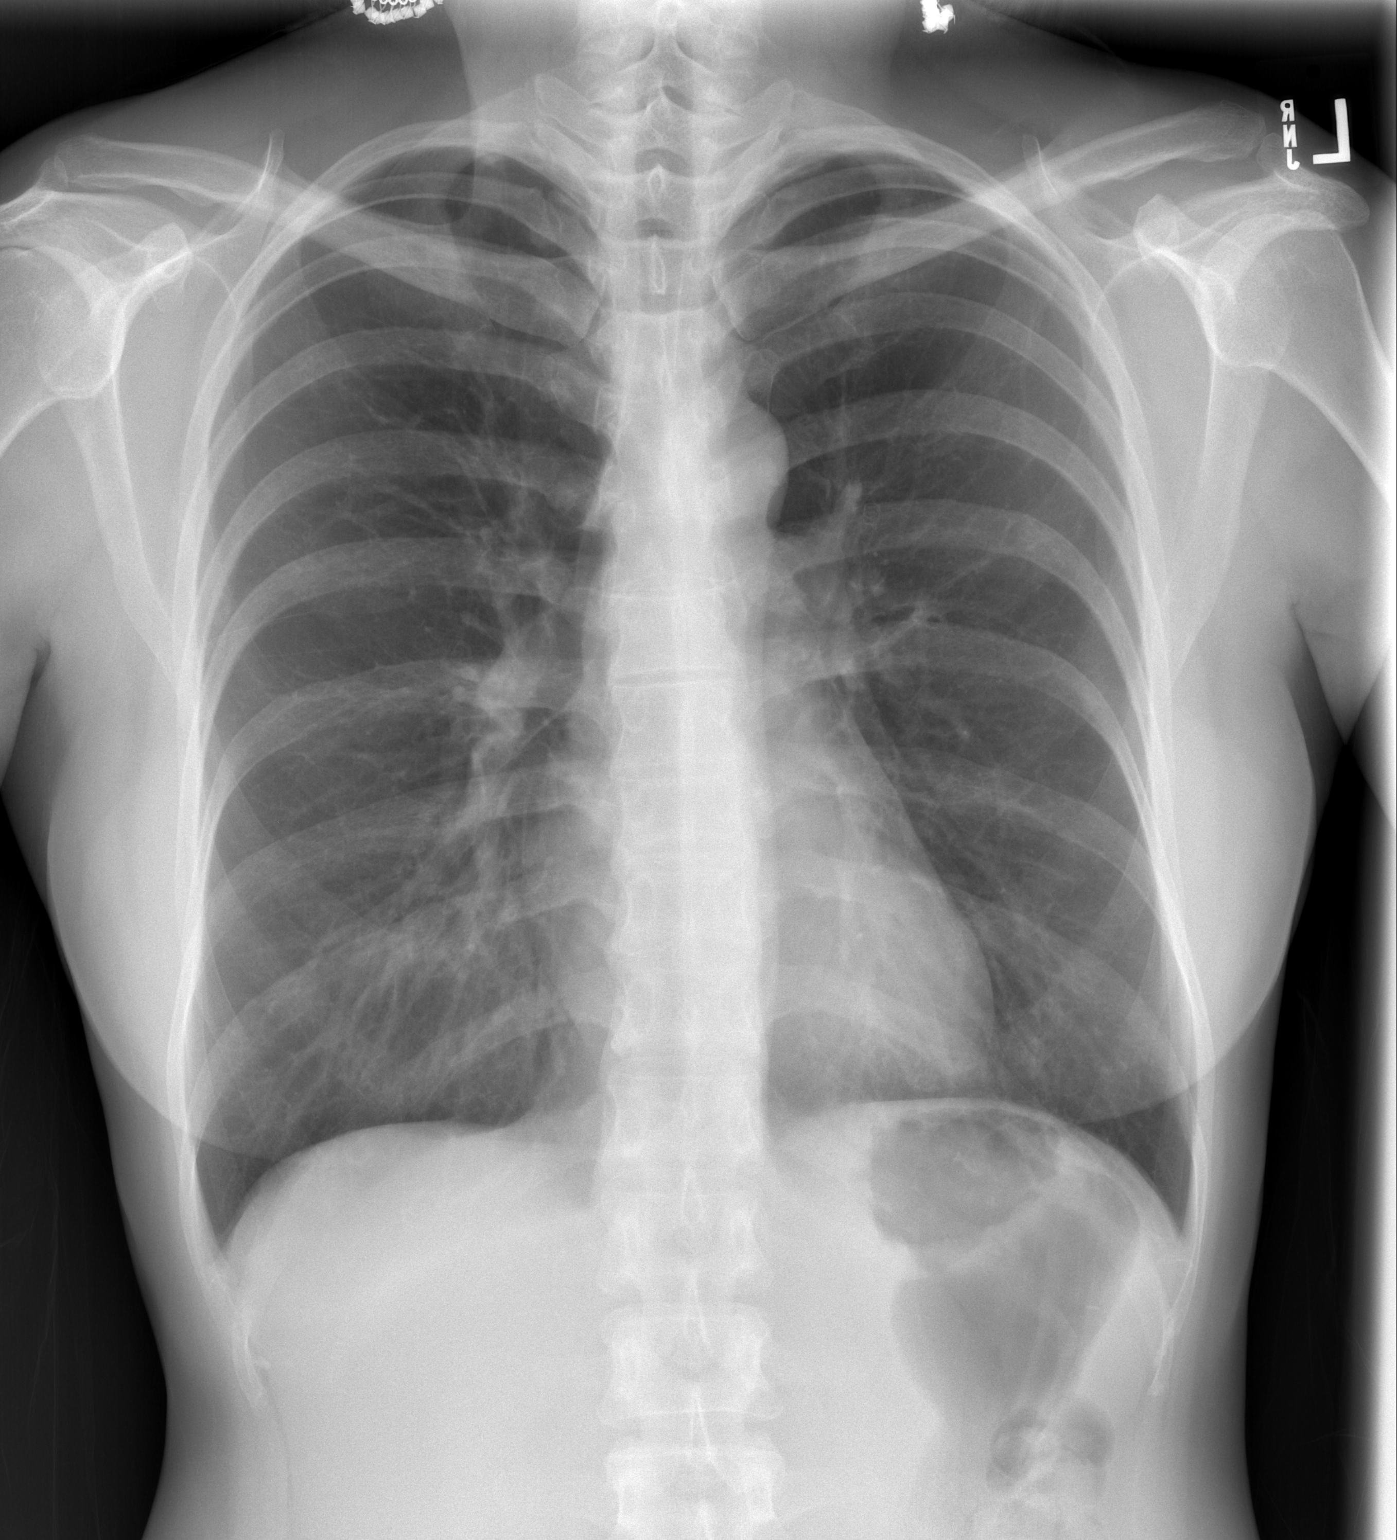

[w chest lat]
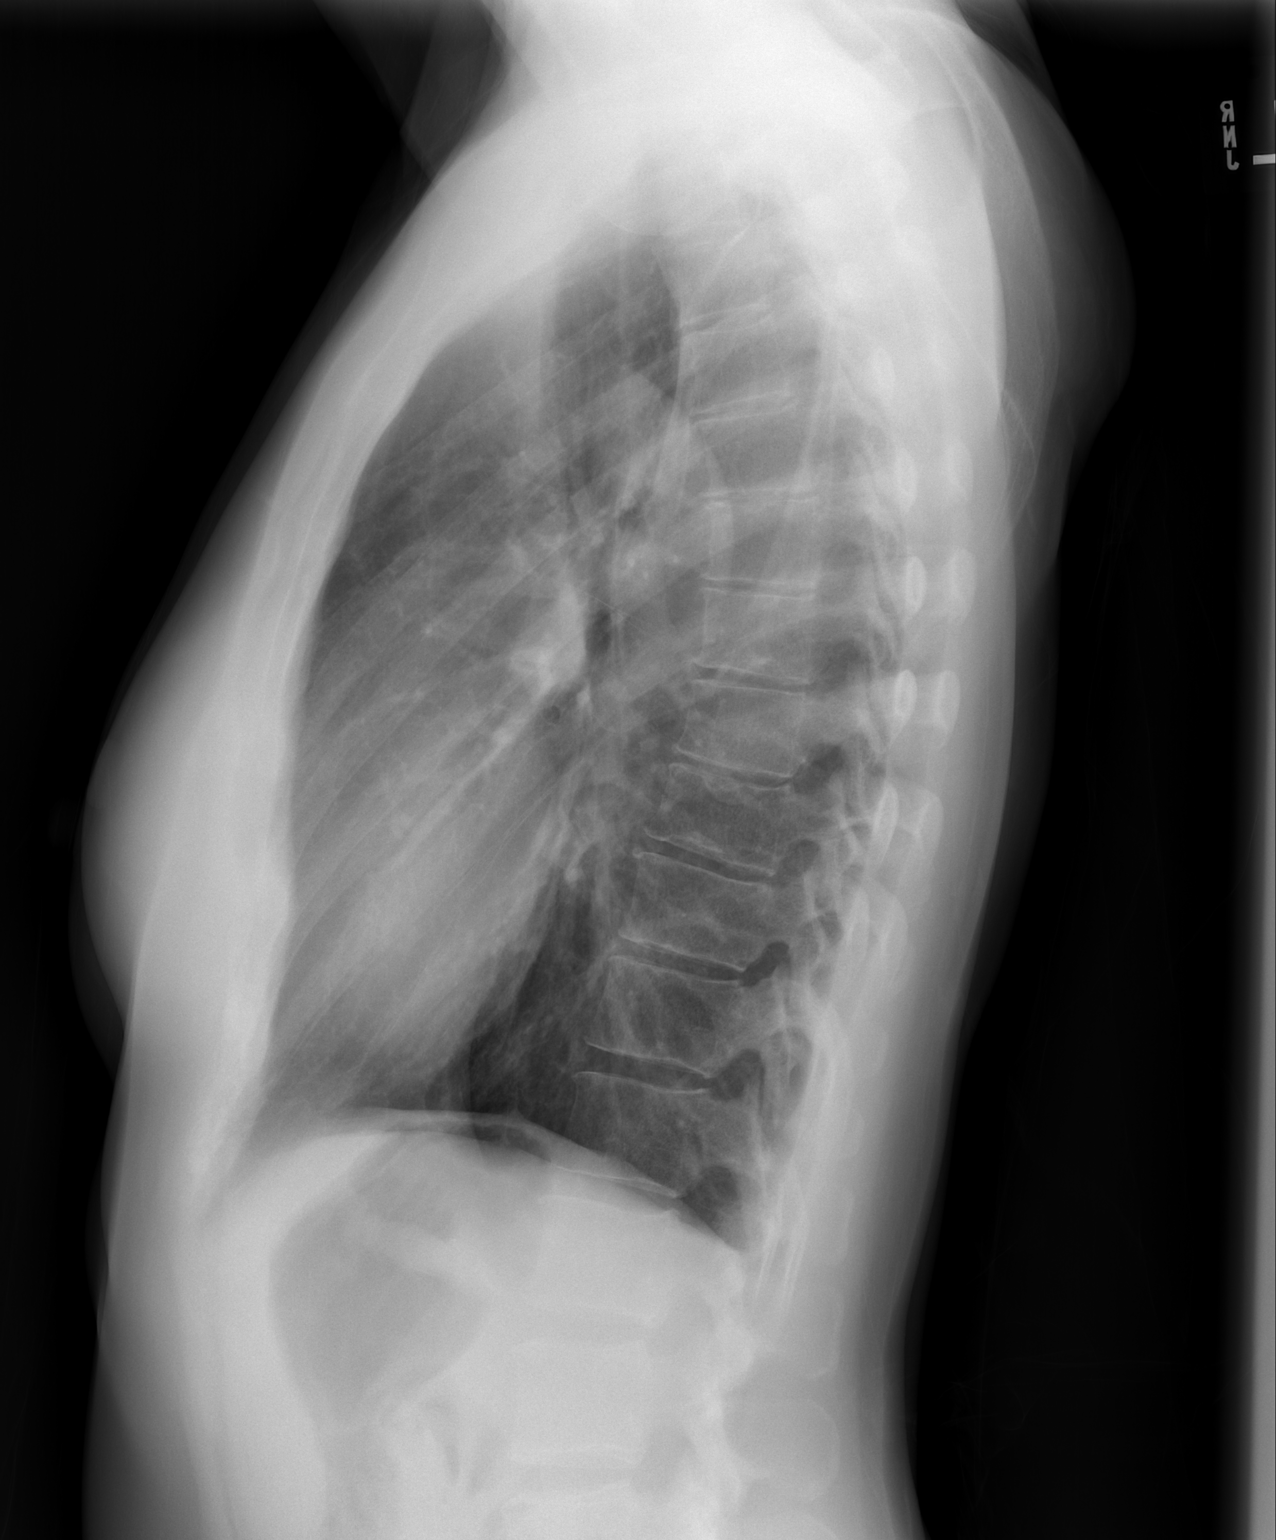

[2 of 2 positions shown; findings below may reference images not displayed]

FINDINGS: Lungs are clear. Heart size and pulmonary vascularity are normal. No
adenopathy. No bone lesions.
IMPRESSION: No edema or consolidation.

## 2020-09-30 ENCOUNTER — Other Ambulatory Visit: Payer: Self-pay

## 2020-09-30 ENCOUNTER — Encounter: Payer: Self-pay | Admitting: Obstetrics & Gynecology

## 2020-09-30 ENCOUNTER — Ambulatory Visit: Payer: BC Managed Care – PPO | Admitting: Obstetrics & Gynecology

## 2020-09-30 VITALS — BP 122/70 | Ht 67.25 in | Wt 156.0 lb

## 2020-09-30 DIAGNOSIS — Z01419 Encounter for gynecological examination (general) (routine) without abnormal findings: Secondary | ICD-10-CM

## 2020-09-30 DIAGNOSIS — N951 Menopausal and female climacteric states: Secondary | ICD-10-CM | POA: Diagnosis not present

## 2020-09-30 MED ORDER — PROGESTERONE MICRONIZED 100 MG PO CAPS: 100.0000 mg | ORAL_CAPSULE | Freq: Every day | ORAL | 4 refills | Status: AC

## 2020-09-30 MED ORDER — PROGESTERONE 200 MG PO CAPS: 200.0000 mg | ORAL_CAPSULE | Freq: Every evening | ORAL | 1 refills | Status: DC

## 2020-09-30 NOTE — Progress Notes (Signed)
Paige Bowen 08/14/1970 [id number]   History:    50 y.o. G0  Boyfriend x 1 year  QB:[id number] presenting for annual gyn exam  JME:[id number], no hot flushes currently.Taking Micronized Progesterone 200 mg HS daily at bedtime. BTB only if forgets to take the Progesterone. No pelvic pain. No pain with IC.  No vaginal d/c.Urine normal, no SUI or urgency.  Had an Acute PyeloNephritis in 07/2020. BMs normal. Breasts normal. BMI Normal at 24.25. Hiking. Health labs with Fam MD.   Past medical history,surgical history, family history and social history were all reviewed and documented in the EPIC chart.  Gynecologic History Patient's last menstrual period was 04/01/2020.  Obstetric History OB History  Gravida Para Term Preterm AB Living  0 0 0 0 0 0  SAB IAB Ectopic Multiple Live Births  0 0 0 0 0     ROS: A ROS was performed and pertinent positives and negatives are included in the history.  GENERAL: No fevers or chills. HEENT: No change in vision, no earache, sore throat or sinus congestion. NECK: No pain or stiffness. CARDIOVASCULAR: No chest pain or pressure. No palpitations. PULMONARY: No shortness of breath, cough or wheeze. GASTROINTESTINAL: No abdominal pain, nausea, vomiting or diarrhea, melena or bright red blood per rectum. GENITOURINARY: No urinary frequency, urgency, hesitancy or dysuria. MUSCULOSKELETAL: No joint or muscle pain, no back pain, no recent trauma. DERMATOLOGIC: No rash, no itching, no lesions. ENDOCRINE: No polyuria, polydipsia, no heat or cold intolerance. No recent change in weight. HEMATOLOGICAL: No anemia or easy bruising or bleeding. NEUROLOGIC: No headache, seizures, numbness, tingling or weakness. PSYCHIATRIC: No depression, no loss of interest in normal activity or change in sleep pattern.     Exam:   BP 122/70   Ht 5' 7.25" (1.708 m)   Wt 156 lb (70.8 kg)   LMP 04/01/2020   BMI 24.25 kg/m   Body mass index is 24.25  kg/m.  General appearance : Well developed well nourished female. No acute distress HEENT: Eyes: no retinal hemorrhage or exudates,  Neck supple, trachea midline, no carotid bruits, no thyroidmegaly Lungs: Clear to auscultation, no rhonchi or wheezes, or rib retractions  Heart: Regular rate and rhythm, no murmurs or gallops Breast:Examined in sitting and supine position were symmetrical in appearance, no palpable masses or tenderness,  no skin retraction, no nipple inversion, no nipple discharge, no skin discoloration, no axillary or supraclavicular lymphadenopathy Abdomen: no palpable masses or tenderness, no rebound or guarding Extremities: no edema or skin discoloration or tenderness  Pelvic: Vulva: Normal             Vagina: No gross lesions or discharge  Cervix: No gross lesions or discharge  Uterus  AV, normal size, shape and consistency, non-tender and mobile  Adnexa  Without masses or tenderness  Anus: Normal   Assessment/Plan:  50 y.o. female for annual exam   1. Well female exam with routine gynecological exam Normal gynecologic exam.  Pap test April 2021 was negative with negative high-risk HPV, will repeat a Pap test at 3 years.  Breast exam normal.  Patient will schedule a screening mammogram this year.  Good body mass index at 24.25.  Continue with hiking and healthy nutrition.  Health labs with family physician.  2. Perimenopause Well on progesterone 100 mg capsule per mouth at bedtime.  No contraindication to continue.  Prescription sent to pharmacy.  Other orders - progesterone (PROMETRIUM) 100 MG capsule; Take 1 capsule (100 mg total) by mouth  at bedtime.  Genia Del MD, 8:32 AM 09/30/2020

## 2020-11-16 ENCOUNTER — Telehealth: Payer: Self-pay

## 2020-11-16 ENCOUNTER — Telehealth (INDEPENDENT_AMBULATORY_CARE_PROVIDER_SITE_OTHER): Payer: Self-pay

## 2020-11-16 DIAGNOSIS — R300 Dysuria: Secondary | ICD-10-CM | POA: Diagnosis not present

## 2020-11-16 DIAGNOSIS — R1031 Right lower quadrant pain: Secondary | ICD-10-CM | POA: Diagnosis not present

## 2020-11-16 NOTE — Telephone Encounter (Signed)
Patient called and left a detailed voice message about a previous encounter for a UTI.  I called the patient back and she stated that an on call doctor prescribed her Cipro for a UTI because she took a home test and show white cells in her test. Patient stated that she has been hospitalized for this before and was prescribed Cipro for 5 days to help. Patient thinks this was around May 6th and stated that she has recently moved and has been under a lot of stress lately. Patient waited to have intercourse on the 6th day and stated that she was not sure if she needed to wait for this. Patient stated that she is having some cramping and just not feeling right and I advised Dr. Karilyn Cota and due to her living in [place] area he advised for her to go to an urgent care so they could check her urine and do a UA and Culture if needed. I advised the patient to go to urgent care today since we do not have an opening and she lives far away. Patient verbalized an understanding.

## 2020-11-16 NOTE — Telephone Encounter (Signed)
Patient called requesting a call from Dr. Mackey Birchwood today. Has already scheduled a virtual visit with ML for tomorrow. Lives in [place], [place] now.  Mentioned she had an Acute PyeloNephritis in 07/2020 and was hospitalized x 4 days and prescribed Cipro.  3 weeks ago and on call MD prescribed Cipro for her again. "stated that an on call doctor prescribed her Cipro for a UTI because she took a home test and show white cells in her test."  Patient called her internist earlier today who recommended she see Urgent Care there in [place] where she is living. (This call is documented in her Epic chart.)  I explained that with the "cramping and just not feeling right"  she is experiencing and her history that she will need an office visit. This is not a situation that can be handled through a virtual visit. She told me her internist earlier recommend she go to Urgent Care for her symptoms and I told her we would agree with that recommendation. Definitely requires a visit with a provider. She said she will go to Urgent Care there in [place].

## 2020-12-07 ENCOUNTER — Other Ambulatory Visit: Payer: Self-pay | Admitting: Internal Medicine

## 2020-12-08 ENCOUNTER — Telehealth (INDEPENDENT_AMBULATORY_CARE_PROVIDER_SITE_OTHER): Payer: BC Managed Care – PPO | Admitting: Internal Medicine

## 2020-12-29 ENCOUNTER — Other Ambulatory Visit (INDEPENDENT_AMBULATORY_CARE_PROVIDER_SITE_OTHER): Payer: Self-pay | Admitting: Internal Medicine

## 2020-12-29 ENCOUNTER — Encounter (INDEPENDENT_AMBULATORY_CARE_PROVIDER_SITE_OTHER): Payer: Self-pay

## 2020-12-29 ENCOUNTER — Telehealth (INDEPENDENT_AMBULATORY_CARE_PROVIDER_SITE_OTHER): Payer: Self-pay

## 2020-12-29 DIAGNOSIS — N63 Unspecified lump in unspecified breast: Secondary | ICD-10-CM

## 2020-12-29 NOTE — Telephone Encounter (Signed)
When I talk to her she said she felt a spot on the left breast from home exam.

## 2020-12-29 NOTE — Telephone Encounter (Signed)
Call the patient back.  I would probably recommend just a screening mammogram as this may be more cost effective for her insurance.  If the screening mammogram does find something, then they can proceed with a diagnostic mammogram.  Let me know if she is agreeable for this and we can order.

## 2020-12-29 NOTE — Telephone Encounter (Signed)
Okay I put the order in for bilateral diagnostic mammogram.

## 2021-01-04 DIAGNOSIS — N6323 Unspecified lump in the left breast, lower outer quadrant: Secondary | ICD-10-CM | POA: Diagnosis not present

## 2021-01-04 DIAGNOSIS — N63 Unspecified lump in unspecified breast: Secondary | ICD-10-CM | POA: Diagnosis not present

## 2021-01-05 ENCOUNTER — Telehealth: Payer: Self-pay | Admitting: *Deleted

## 2021-01-05 DIAGNOSIS — N951 Menopausal and female climacteric states: Secondary | ICD-10-CM

## 2021-01-05 NOTE — Telephone Encounter (Signed)
Patient called just as FYI, reports not cycle in about 6-9 months and then yesterday she has 1 days of bleeding. Today the bleeding has stopped, she is still taking progesterone 100 mg capsules as recommended, and takes NP thyroid 90 mg tablet prescribed by PCP. She also reports she found a lump in her breast, she has mammogram and no abnormalities notes on imaging. Patient has still not found a GYN in [place] area, still looking for one. Reports she just wanted me to relay this information to you.

## 2021-01-10 NOTE — Telephone Encounter (Signed)
Patient informed with below note, order placed for pelvic ultrasound. Will route message to appointments to schedule breast and ultrasound appointment.

## 2021-02-14 ENCOUNTER — Ambulatory Visit (INDEPENDENT_AMBULATORY_CARE_PROVIDER_SITE_OTHER): Payer: BC Managed Care – PPO | Admitting: Internal Medicine

## 2021-03-08 DIAGNOSIS — N951 Menopausal and female climacteric states: Secondary | ICD-10-CM | POA: Diagnosis not present

## 2021-03-08 DIAGNOSIS — D649 Anemia, unspecified: Secondary | ICD-10-CM | POA: Diagnosis not present

## 2021-03-08 DIAGNOSIS — Z1329 Encounter for screening for other suspected endocrine disorder: Secondary | ICD-10-CM | POA: Diagnosis not present

## 2021-03-08 DIAGNOSIS — Z13228 Encounter for screening for other metabolic disorders: Secondary | ICD-10-CM | POA: Diagnosis not present

## 2021-03-08 DIAGNOSIS — Z130 Encounter for screening for diseases of the blood and blood-forming organs and certain disorders involving the immune mechanism: Secondary | ICD-10-CM | POA: Diagnosis not present

## 2021-03-08 DIAGNOSIS — N63 Unspecified lump in unspecified breast: Secondary | ICD-10-CM | POA: Diagnosis not present

## 2021-03-08 DIAGNOSIS — J45991 Cough variant asthma: Secondary | ICD-10-CM | POA: Diagnosis not present

## 2021-03-08 DIAGNOSIS — N179 Acute kidney failure, unspecified: Secondary | ICD-10-CM | POA: Diagnosis not present

## 2021-03-08 DIAGNOSIS — R3129 Other microscopic hematuria: Secondary | ICD-10-CM | POA: Diagnosis not present

## 2021-03-14 DIAGNOSIS — R3129 Other microscopic hematuria: Secondary | ICD-10-CM | POA: Diagnosis not present

## 2021-03-25 DIAGNOSIS — N6320 Unspecified lump in the left breast, unspecified quadrant: Secondary | ICD-10-CM | POA: Diagnosis not present

## 2021-03-25 DIAGNOSIS — R922 Inconclusive mammogram: Secondary | ICD-10-CM | POA: Diagnosis not present

## 2021-03-25 DIAGNOSIS — Z23 Encounter for immunization: Secondary | ICD-10-CM | POA: Diagnosis not present

## 2021-04-13 DIAGNOSIS — Z23 Encounter for immunization: Secondary | ICD-10-CM | POA: Diagnosis not present

## 2021-04-13 DIAGNOSIS — F319 Bipolar disorder, unspecified: Secondary | ICD-10-CM | POA: Diagnosis not present

## 2021-05-06 DIAGNOSIS — Z1211 Encounter for screening for malignant neoplasm of colon: Secondary | ICD-10-CM | POA: Diagnosis not present

## 2021-05-31 DIAGNOSIS — N951 Menopausal and female climacteric states: Secondary | ICD-10-CM | POA: Diagnosis not present
